# Patient Record
Sex: Female | Born: 1965 | Race: Black or African American | Hispanic: No | Marital: Married | Smoking: Never smoker
Health system: Southern US, Community
[De-identification: ages and names within clinical notes are randomized; demographics above are authoritative.]

## PROBLEM LIST (undated history)

## (undated) DIAGNOSIS — K668 Other specified disorders of peritoneum: Secondary | ICD-10-CM

## (undated) DIAGNOSIS — N809 Endometriosis, unspecified: Secondary | ICD-10-CM

## (undated) DIAGNOSIS — C801 Malignant (primary) neoplasm, unspecified: Secondary | ICD-10-CM

## (undated) DIAGNOSIS — E039 Hypothyroidism, unspecified: Secondary | ICD-10-CM

## (undated) DIAGNOSIS — E049 Nontoxic goiter, unspecified: Secondary | ICD-10-CM

## (undated) DIAGNOSIS — D219 Benign neoplasm of connective and other soft tissue, unspecified: Secondary | ICD-10-CM

## (undated) DIAGNOSIS — N83202 Unspecified ovarian cyst, left side: Secondary | ICD-10-CM

## (undated) DIAGNOSIS — I10 Essential (primary) hypertension: Secondary | ICD-10-CM

## (undated) DIAGNOSIS — D249 Benign neoplasm of unspecified breast: Secondary | ICD-10-CM

## (undated) DIAGNOSIS — J302 Other seasonal allergic rhinitis: Secondary | ICD-10-CM

## (undated) HISTORY — PX: MYOMECTOMY: SHX85

## (undated) HISTORY — PX: LAPAROSCOPY: SHX197

## (undated) HISTORY — DX: Other specified disorders of peritoneum: K66.8

## (undated) HISTORY — DX: Benign neoplasm of connective and other soft tissue, unspecified: D21.9

## (undated) HISTORY — PX: BREAST SURGERY: SHX581

## (undated) HISTORY — DX: Hypothyroidism, unspecified: E03.9

## (undated) HISTORY — PX: TOTAL THYROIDECTOMY: SHX2547

## (undated) HISTORY — DX: Nontoxic goiter, unspecified: E04.9

## (undated) HISTORY — DX: Benign neoplasm of unspecified breast: D24.9

## (undated) HISTORY — DX: Endometriosis, unspecified: N80.9

## (undated) HISTORY — PX: WISDOM TOOTH EXTRACTION: SHX21

## (undated) HISTORY — DX: Unspecified ovarian cyst, left side: N83.202

---

## 2001-12-02 ENCOUNTER — Ambulatory Visit (HOSPITAL_COMMUNITY): Admission: RE | Admit: 2001-12-02 | Discharge: 2001-12-02 | Payer: Self-pay | Admitting: Obstetrics and Gynecology

## 2001-12-02 ENCOUNTER — Encounter: Payer: Self-pay | Admitting: Obstetrics and Gynecology

## 2001-12-13 ENCOUNTER — Other Ambulatory Visit: Admission: RE | Admit: 2001-12-13 | Discharge: 2001-12-13 | Payer: Self-pay | Admitting: Pediatrics

## 2002-02-07 ENCOUNTER — Ambulatory Visit (HOSPITAL_COMMUNITY): Admission: RE | Admit: 2002-02-07 | Discharge: 2002-02-07 | Payer: Self-pay | Admitting: Obstetrics and Gynecology

## 2002-02-07 ENCOUNTER — Encounter (INDEPENDENT_AMBULATORY_CARE_PROVIDER_SITE_OTHER): Payer: Self-pay

## 2002-02-07 ENCOUNTER — Encounter (INDEPENDENT_AMBULATORY_CARE_PROVIDER_SITE_OTHER): Payer: Self-pay | Admitting: Specialist

## 2002-02-07 HISTORY — PX: HYSTEROSCOPY WITH D & C: SHX1775

## 2002-02-25 ENCOUNTER — Ambulatory Visit (HOSPITAL_COMMUNITY): Admission: RE | Admit: 2002-02-25 | Discharge: 2002-02-25 | Payer: Self-pay | Admitting: Endocrinology

## 2002-02-26 ENCOUNTER — Encounter: Payer: Self-pay | Admitting: Endocrinology

## 2002-03-04 ENCOUNTER — Ambulatory Visit (HOSPITAL_COMMUNITY): Admission: RE | Admit: 2002-03-04 | Discharge: 2002-03-04 | Payer: Self-pay | Admitting: Obstetrics and Gynecology

## 2002-03-04 ENCOUNTER — Encounter: Payer: Self-pay | Admitting: Obstetrics and Gynecology

## 2002-03-13 ENCOUNTER — Ambulatory Visit (HOSPITAL_COMMUNITY): Admission: RE | Admit: 2002-03-13 | Discharge: 2002-03-13 | Payer: Self-pay | Admitting: Endocrinology

## 2002-03-13 ENCOUNTER — Encounter: Payer: Self-pay | Admitting: Endocrinology

## 2002-04-14 ENCOUNTER — Encounter (INDEPENDENT_AMBULATORY_CARE_PROVIDER_SITE_OTHER): Payer: Self-pay | Admitting: Specialist

## 2002-04-14 ENCOUNTER — Inpatient Hospital Stay (HOSPITAL_COMMUNITY): Admission: RE | Admit: 2002-04-14 | Discharge: 2002-04-16 | Payer: Self-pay | Admitting: Obstetrics and Gynecology

## 2003-03-12 ENCOUNTER — Other Ambulatory Visit: Admission: RE | Admit: 2003-03-12 | Discharge: 2003-03-12 | Payer: Self-pay | Admitting: Obstetrics and Gynecology

## 2004-02-24 ENCOUNTER — Ambulatory Visit (HOSPITAL_COMMUNITY): Admission: RE | Admit: 2004-02-24 | Discharge: 2004-02-24 | Payer: Self-pay | Admitting: Obstetrics and Gynecology

## 2004-04-25 ENCOUNTER — Ambulatory Visit (HOSPITAL_COMMUNITY): Admission: RE | Admit: 2004-04-25 | Discharge: 2004-04-25 | Payer: Self-pay | Admitting: Obstetrics and Gynecology

## 2004-07-15 ENCOUNTER — Inpatient Hospital Stay (HOSPITAL_COMMUNITY): Admission: AD | Admit: 2004-07-15 | Discharge: 2004-07-20 | Payer: Self-pay | Admitting: Obstetrics and Gynecology

## 2004-07-17 ENCOUNTER — Encounter (INDEPENDENT_AMBULATORY_CARE_PROVIDER_SITE_OTHER): Payer: Self-pay | Admitting: Specialist

## 2005-01-23 ENCOUNTER — Other Ambulatory Visit: Admission: RE | Admit: 2005-01-23 | Discharge: 2005-01-23 | Payer: Self-pay | Admitting: Obstetrics and Gynecology

## 2005-02-14 ENCOUNTER — Ambulatory Visit (HOSPITAL_COMMUNITY): Admission: RE | Admit: 2005-02-14 | Discharge: 2005-02-14 | Payer: Self-pay | Admitting: Endocrinology

## 2005-02-23 ENCOUNTER — Ambulatory Visit (HOSPITAL_COMMUNITY): Admission: RE | Admit: 2005-02-23 | Discharge: 2005-02-23 | Payer: Self-pay | Admitting: Endocrinology

## 2005-02-23 ENCOUNTER — Encounter (INDEPENDENT_AMBULATORY_CARE_PROVIDER_SITE_OTHER): Payer: Self-pay | Admitting: Specialist

## 2005-08-12 IMAGING — US US BIOPSY
1 series · 6 of 6 positions shown · non-contrast
Comparison: 02/14/05 thyroid ultrasound.

CLINICAL DATA: Palpable large right thyroid mass.
Right thyroid mass CORE AND FINE NEEDLE ASPIRATION BIOPSIES:

[Series 1: unknown · 0.07mm/px · 6 of 6 slices shown]
[im 1/6]
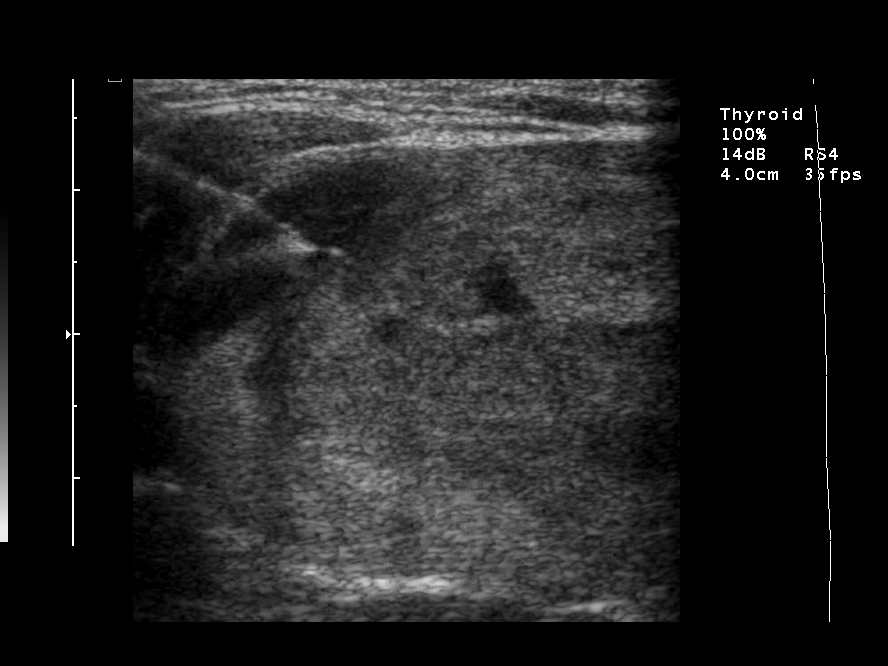
[im 2/6]
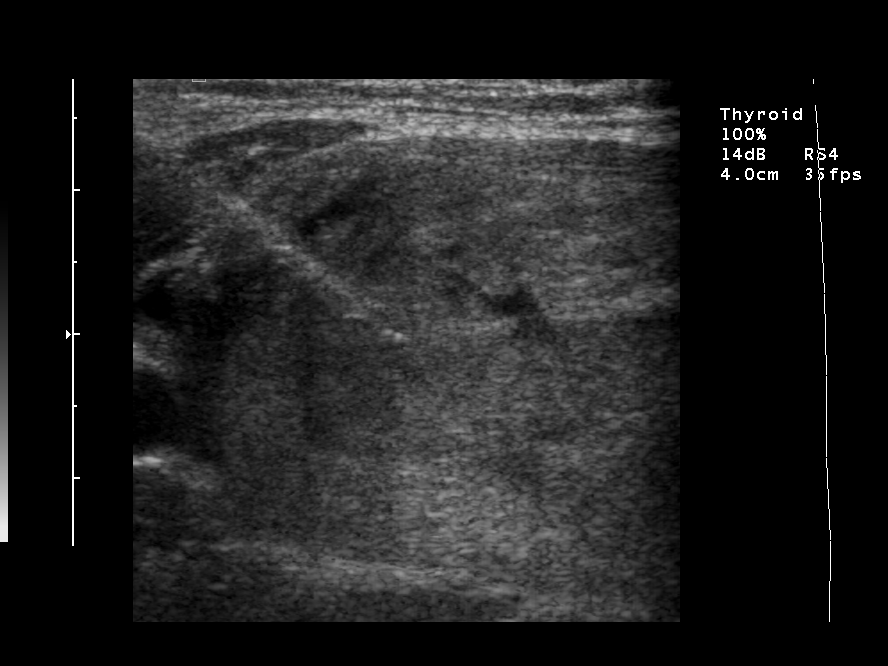
[im 3/6]
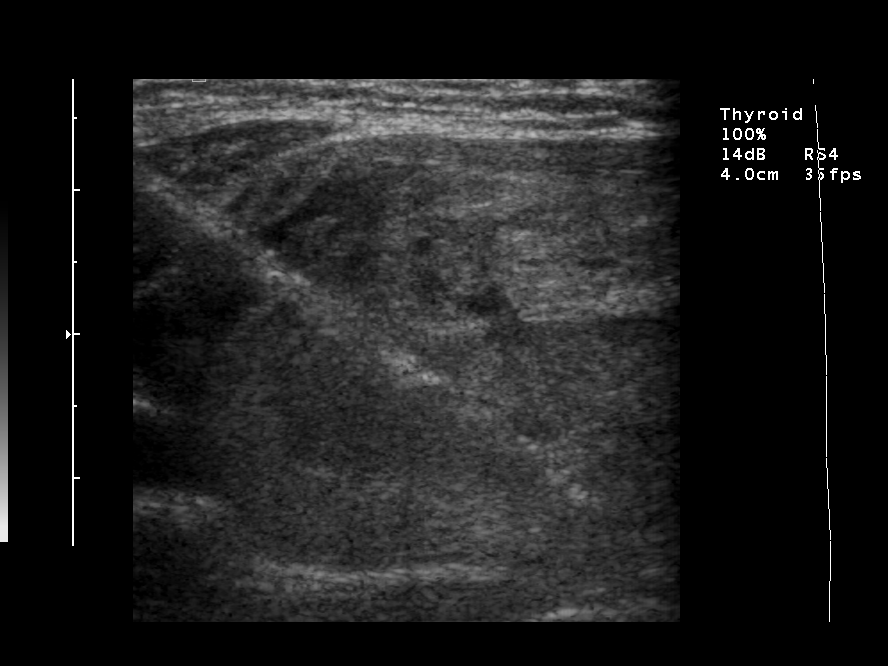
[im 4/6]
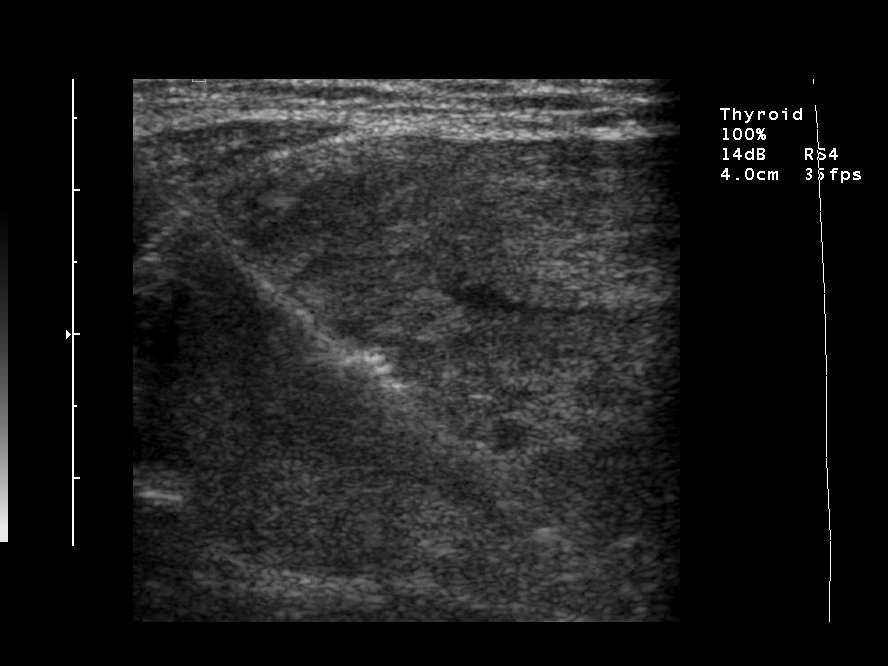
[im 5/6]
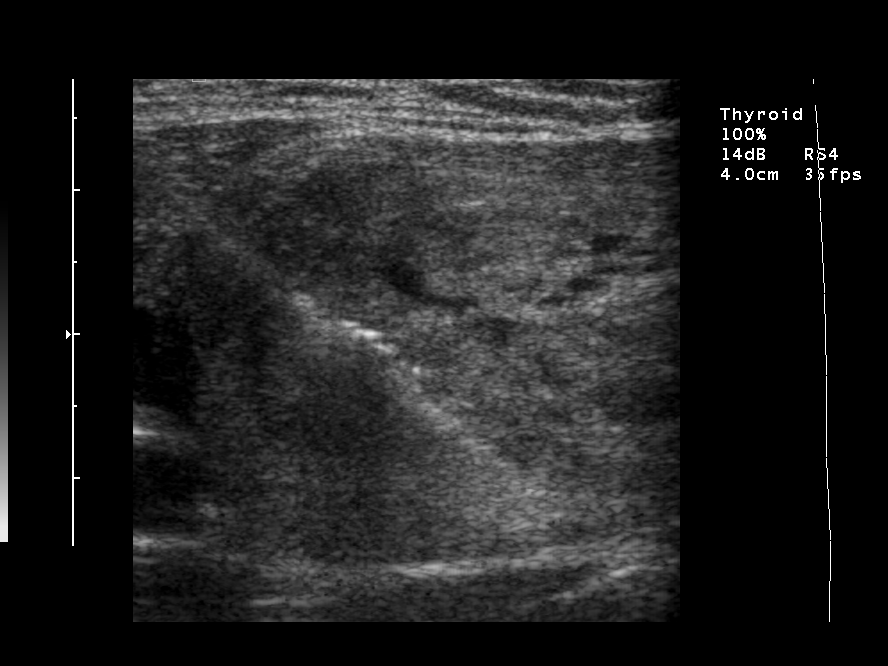
[im 6/6]
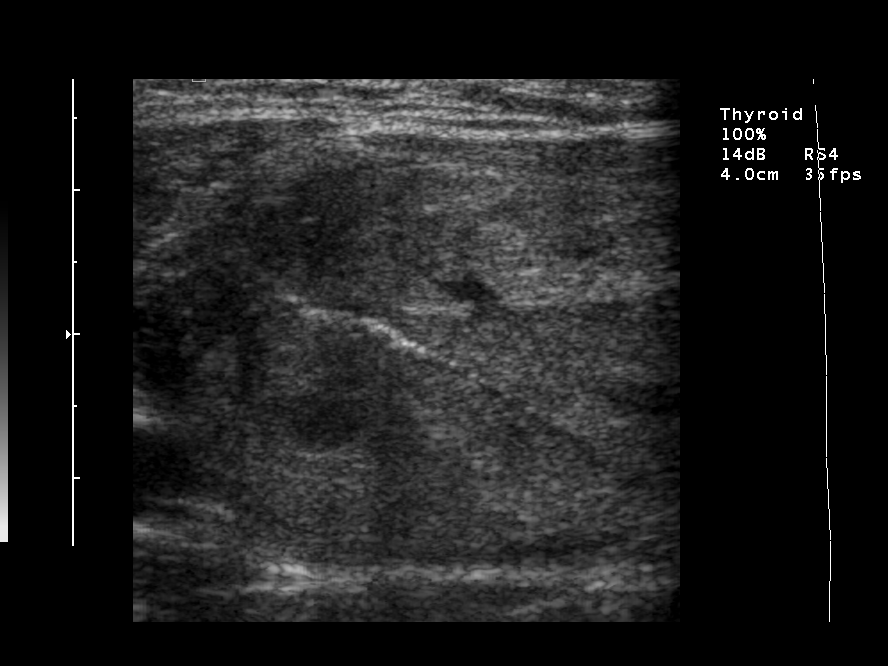

[6 of 6 positions shown; findings below may reference images not displayed]

Radiologist:  Gulzhakhan Kulbaeva, M.D.
Guidance:  Ultrasound.
Complications:  No immediate complications.
Procedure/Findings:  Preliminary ultrasound was performed of the right thyroid lobe re-demonstrating the diffuse right thyroid mass occupying the entire right side.  Under sterile conditions and local anesthesia, a 19 gauge coaxial guide needle was advanced into the lesion.  Through the access, three 20 gauge core biopsies were obtained and placed in formalin.  The 19 gauge needle access was removed.  For cytology, 3 additional FNA biopsies were performed with 25 gauge needles, and slides were prepared with the cytotechnologist.  
The patient tolerated the procedure well.  There were no immediate complications.
IMPRESSION: Right thyroid mass core and FNA biopsies utilizing ultrasound guidance.

## 2005-09-15 ENCOUNTER — Encounter: Admission: RE | Admit: 2005-09-15 | Discharge: 2005-09-15 | Payer: Self-pay | Admitting: Family Medicine

## 2005-12-04 DIAGNOSIS — C801 Malignant (primary) neoplasm, unspecified: Secondary | ICD-10-CM

## 2005-12-04 HISTORY — DX: Malignant (primary) neoplasm, unspecified: C80.1

## 2006-01-26 ENCOUNTER — Other Ambulatory Visit: Admission: RE | Admit: 2006-01-26 | Discharge: 2006-01-26 | Payer: Self-pay | Admitting: Obstetrics and Gynecology

## 2006-02-06 ENCOUNTER — Encounter: Admission: RE | Admit: 2006-02-06 | Discharge: 2006-02-06 | Payer: Self-pay | Admitting: Obstetrics and Gynecology

## 2006-02-22 ENCOUNTER — Encounter: Admission: RE | Admit: 2006-02-22 | Discharge: 2006-02-22 | Payer: Self-pay | Admitting: Obstetrics and Gynecology

## 2006-05-14 ENCOUNTER — Ambulatory Visit (HOSPITAL_COMMUNITY): Admission: RE | Admit: 2006-05-14 | Discharge: 2006-05-14 | Payer: Self-pay | Admitting: Endocrinology

## 2006-08-07 ENCOUNTER — Encounter: Admission: RE | Admit: 2006-08-07 | Discharge: 2006-08-07 | Payer: Self-pay | Admitting: Obstetrics and Gynecology

## 2006-09-10 ENCOUNTER — Ambulatory Visit (HOSPITAL_COMMUNITY): Admission: RE | Admit: 2006-09-10 | Discharge: 2006-09-11 | Payer: Self-pay | Admitting: General Surgery

## 2006-09-10 ENCOUNTER — Encounter (INDEPENDENT_AMBULATORY_CARE_PROVIDER_SITE_OTHER): Payer: Self-pay | Admitting: Specialist

## 2006-11-06 ENCOUNTER — Encounter (INDEPENDENT_AMBULATORY_CARE_PROVIDER_SITE_OTHER): Payer: Self-pay | Admitting: *Deleted

## 2006-11-06 ENCOUNTER — Ambulatory Visit (HOSPITAL_COMMUNITY): Admission: RE | Admit: 2006-11-06 | Discharge: 2006-11-07 | Payer: Self-pay | Admitting: General Surgery

## 2006-11-23 ENCOUNTER — Ambulatory Visit (HOSPITAL_BASED_OUTPATIENT_CLINIC_OR_DEPARTMENT_OTHER): Admission: RE | Admit: 2006-11-23 | Discharge: 2006-11-23 | Payer: Self-pay | Admitting: Ophthalmology

## 2006-12-24 ENCOUNTER — Encounter (HOSPITAL_COMMUNITY): Admission: RE | Admit: 2006-12-24 | Discharge: 2007-03-19 | Payer: Self-pay | Admitting: Endocrinology

## 2007-02-12 ENCOUNTER — Encounter: Admission: RE | Admit: 2007-02-12 | Discharge: 2007-02-12 | Payer: Self-pay | Admitting: Obstetrics and Gynecology

## 2007-02-20 IMAGING — CR DG CHEST 2V
2 series · 2 of 2 positions shown · non-contrast
Comparison: none

CLINICAL DATA: Preoperative respiratory exam for thyroidectomy.  Thyroid nodule. 
 CHEST - 2 VIEW:
 The heart size and mediastinal contours are within normal limits.  Both lungs are clear.  The visualized skeletal structures are unremarkable.

[view not recorded (1 of 2)]
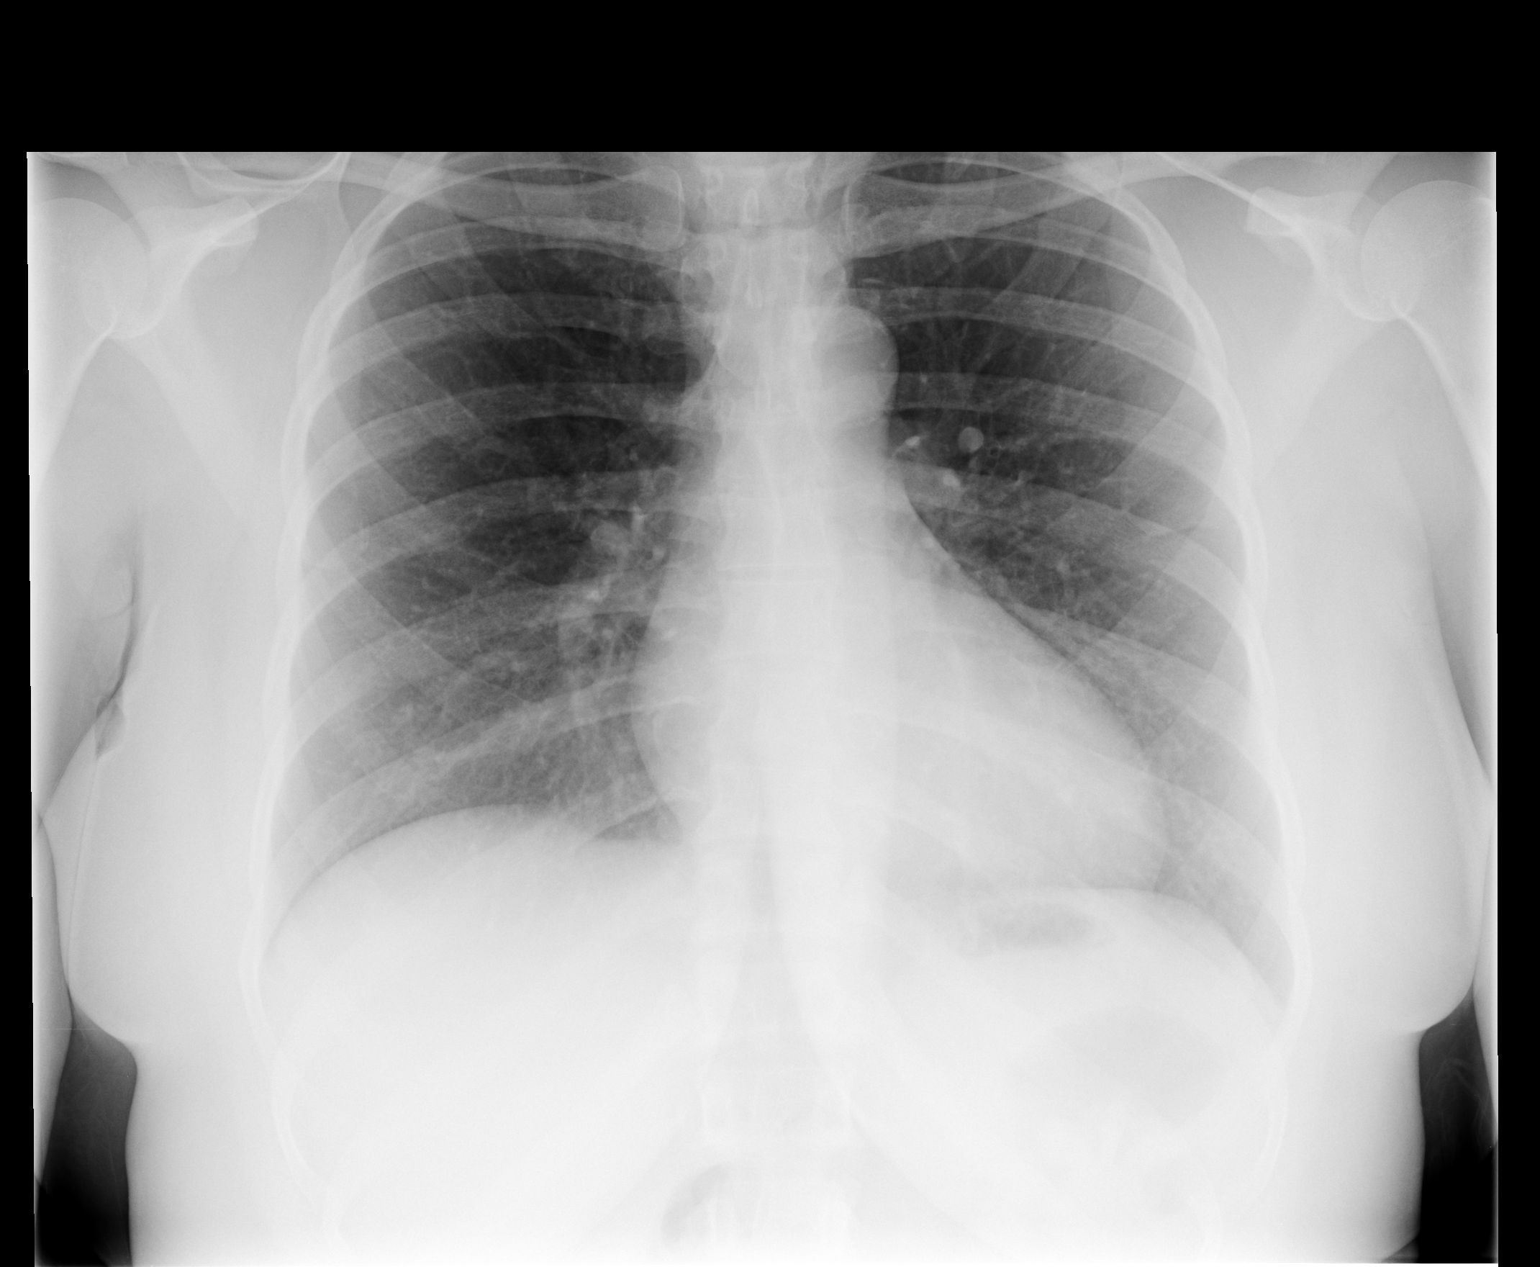

[view not recorded (2 of 2)]
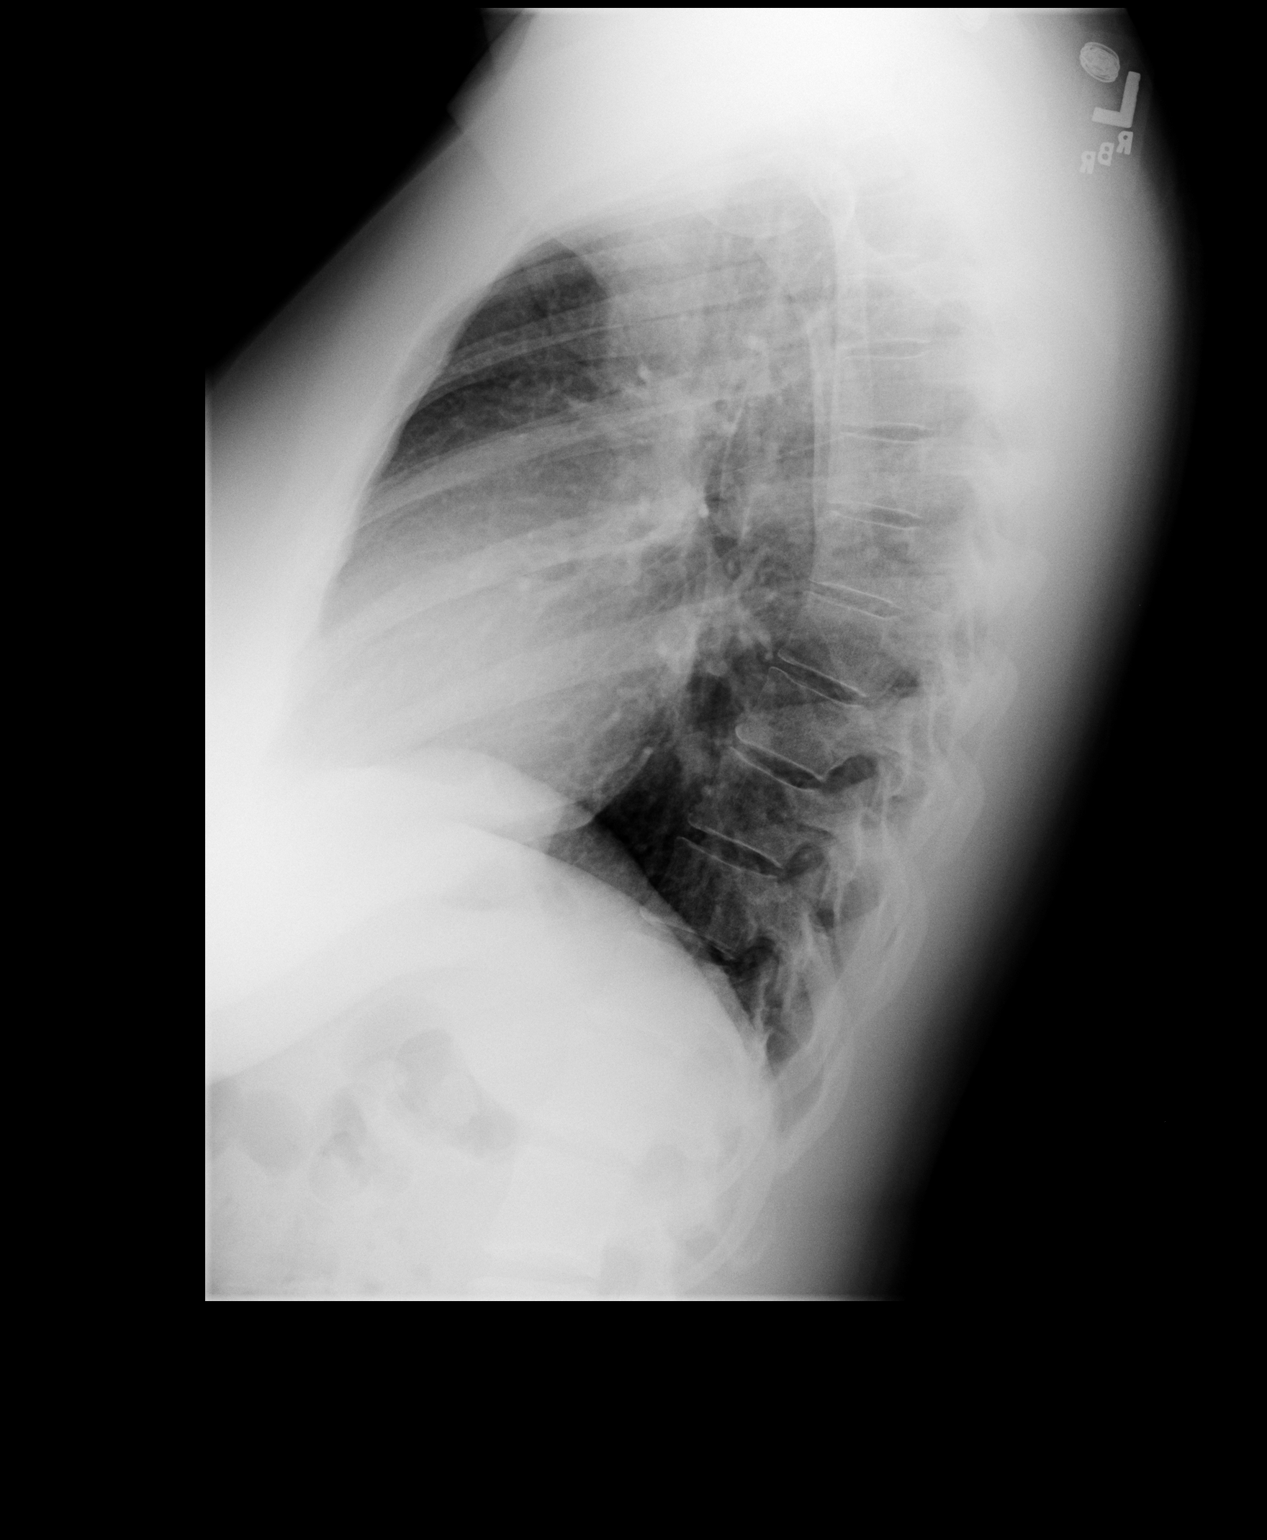

[2 of 2 positions shown; findings below may reference images not displayed]

IMPRESSION: No active cardiopulmonary disease.

## 2007-07-15 ENCOUNTER — Encounter (HOSPITAL_COMMUNITY): Admission: RE | Admit: 2007-07-15 | Discharge: 2007-07-19 | Payer: Self-pay | Admitting: Endocrinology

## 2008-02-14 ENCOUNTER — Encounter: Admission: RE | Admit: 2008-02-14 | Discharge: 2008-02-14 | Payer: Self-pay | Admitting: Obstetrics and Gynecology

## 2008-03-02 ENCOUNTER — Encounter (INDEPENDENT_AMBULATORY_CARE_PROVIDER_SITE_OTHER): Payer: Self-pay | Admitting: Diagnostic Radiology

## 2008-03-02 ENCOUNTER — Encounter: Admission: RE | Admit: 2008-03-02 | Discharge: 2008-03-02 | Payer: Self-pay | Admitting: Obstetrics and Gynecology

## 2008-07-06 ENCOUNTER — Encounter: Admission: RE | Admit: 2008-07-06 | Discharge: 2008-07-06 | Payer: Self-pay | Admitting: Endocrinology

## 2008-07-07 ENCOUNTER — Encounter: Admission: RE | Admit: 2008-07-07 | Discharge: 2008-07-07 | Payer: Self-pay | Admitting: Endocrinology

## 2008-09-23 ENCOUNTER — Encounter: Admission: RE | Admit: 2008-09-23 | Discharge: 2008-09-23 | Payer: Self-pay | Admitting: Family Medicine

## 2008-12-04 DIAGNOSIS — K668 Other specified disorders of peritoneum: Secondary | ICD-10-CM

## 2008-12-04 HISTORY — PX: BREAST EXCISIONAL BIOPSY: SUR124

## 2008-12-04 HISTORY — DX: Other specified disorders of peritoneum: K66.8

## 2008-12-23 ENCOUNTER — Ambulatory Visit (HOSPITAL_COMMUNITY): Admission: RE | Admit: 2008-12-23 | Discharge: 2008-12-23 | Payer: Self-pay | Admitting: Internal Medicine

## 2009-02-16 ENCOUNTER — Encounter: Admission: RE | Admit: 2009-02-16 | Discharge: 2009-02-16 | Payer: Self-pay | Admitting: Obstetrics and Gynecology

## 2009-02-22 ENCOUNTER — Encounter: Admission: RE | Admit: 2009-02-22 | Discharge: 2009-02-22 | Payer: Self-pay | Admitting: General Surgery

## 2009-03-22 ENCOUNTER — Ambulatory Visit (HOSPITAL_BASED_OUTPATIENT_CLINIC_OR_DEPARTMENT_OTHER): Admission: RE | Admit: 2009-03-22 | Discharge: 2009-03-22 | Payer: Self-pay | Admitting: General Surgery

## 2009-03-22 ENCOUNTER — Encounter: Admission: RE | Admit: 2009-03-22 | Discharge: 2009-03-22 | Payer: Self-pay | Admitting: General Surgery

## 2009-03-22 ENCOUNTER — Encounter (INDEPENDENT_AMBULATORY_CARE_PROVIDER_SITE_OTHER): Payer: Self-pay | Admitting: General Surgery

## 2009-07-12 ENCOUNTER — Encounter (HOSPITAL_COMMUNITY): Admission: RE | Admit: 2009-07-12 | Discharge: 2009-09-02 | Payer: Self-pay | Admitting: Endocrinology

## 2009-08-05 IMAGING — US UNKNOWN US STUDY
1 series · 6 of 6 positions shown · non-contrast
Comparison: 02/14/2008, 02/12/2007, and  02/06/2006 studies.

CLINICAL DATA: Reevaluation of left breast nodule.  The patient
has undergone previous ultrasound guided core biopsy of the left
breast nodule.  The pathology demonstrated biphasic proliferation
consistent with a complex fibroadenoma.  The patient failed to
return for recommended 6-month follow-up evaluation of the left
breast.

DIGITAL DIAGNOSTIC  BILATERAL  MAMMOGRAM  WITH CAD AND LEFT BREAST
ULTRASOUND:

[Series 1: unknown us study · 6 of 6 slices shown]
[im 1/6]
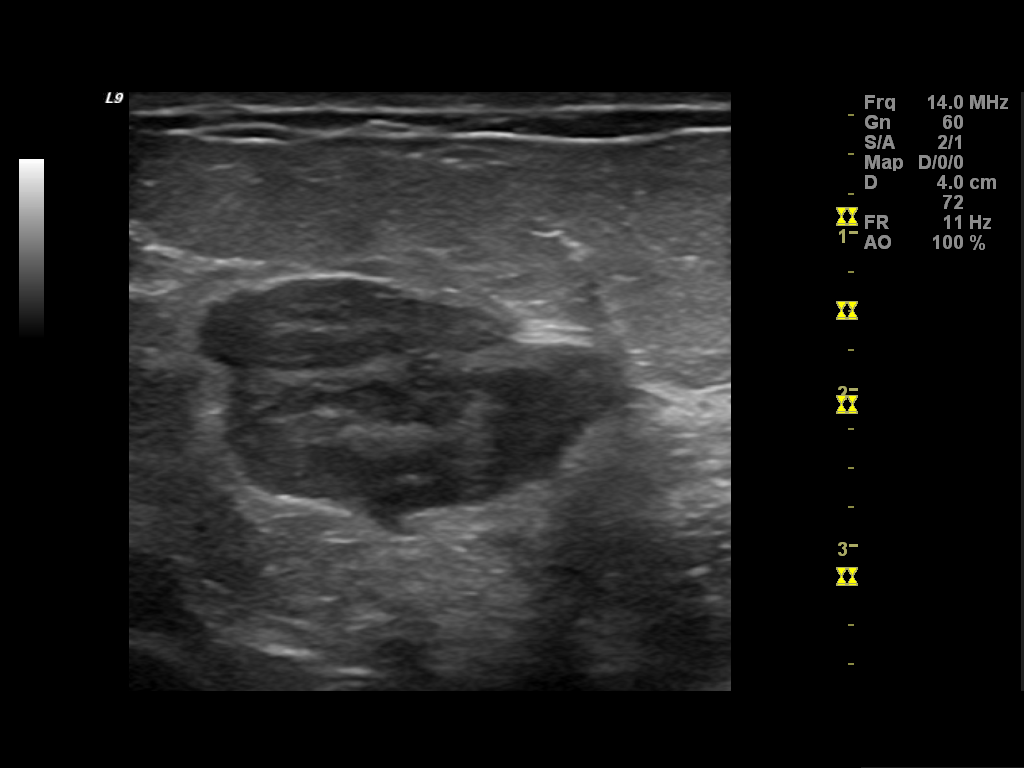
[im 2/6]
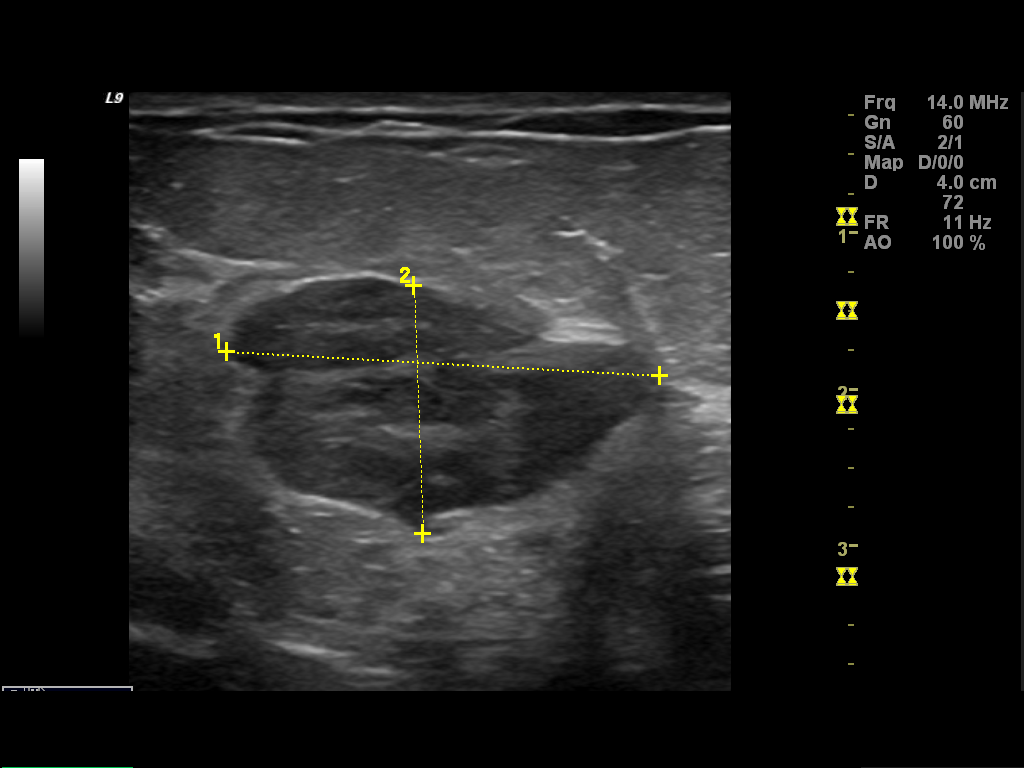
[im 3/6]
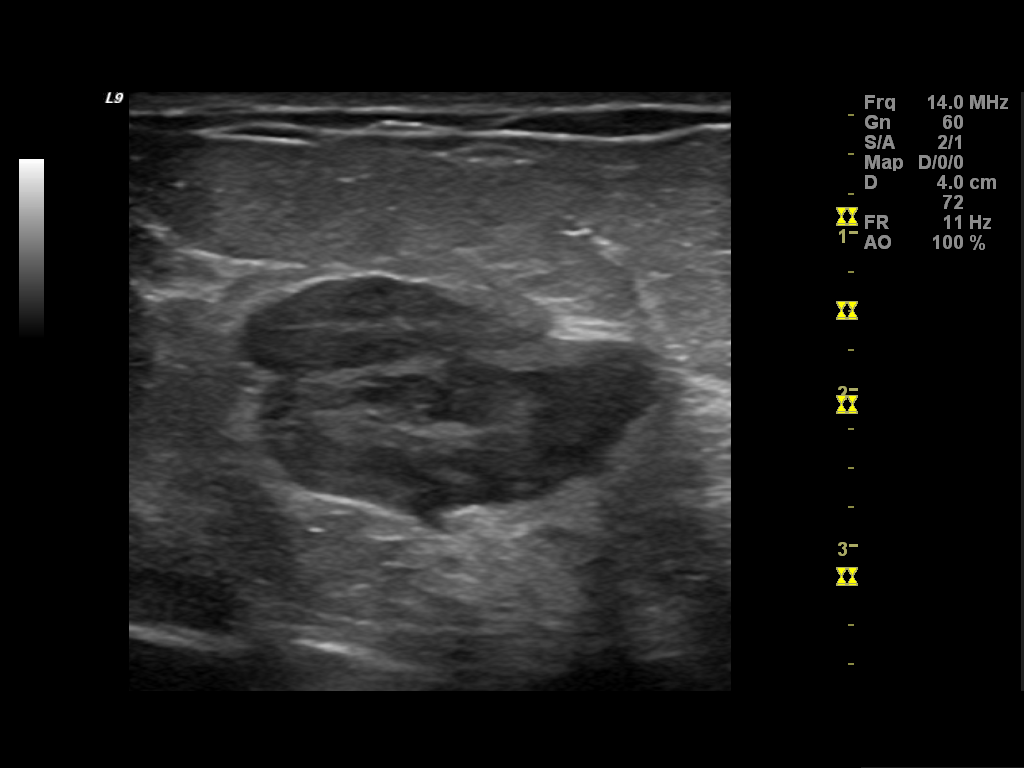
[im 4/6]
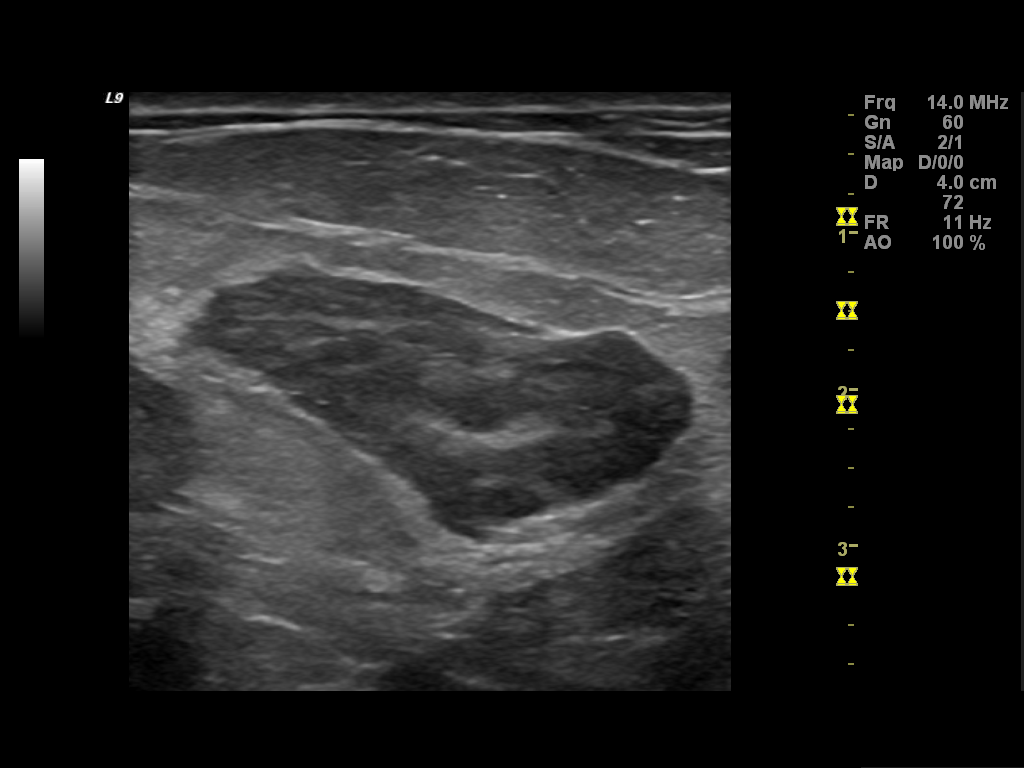
[im 5/6]
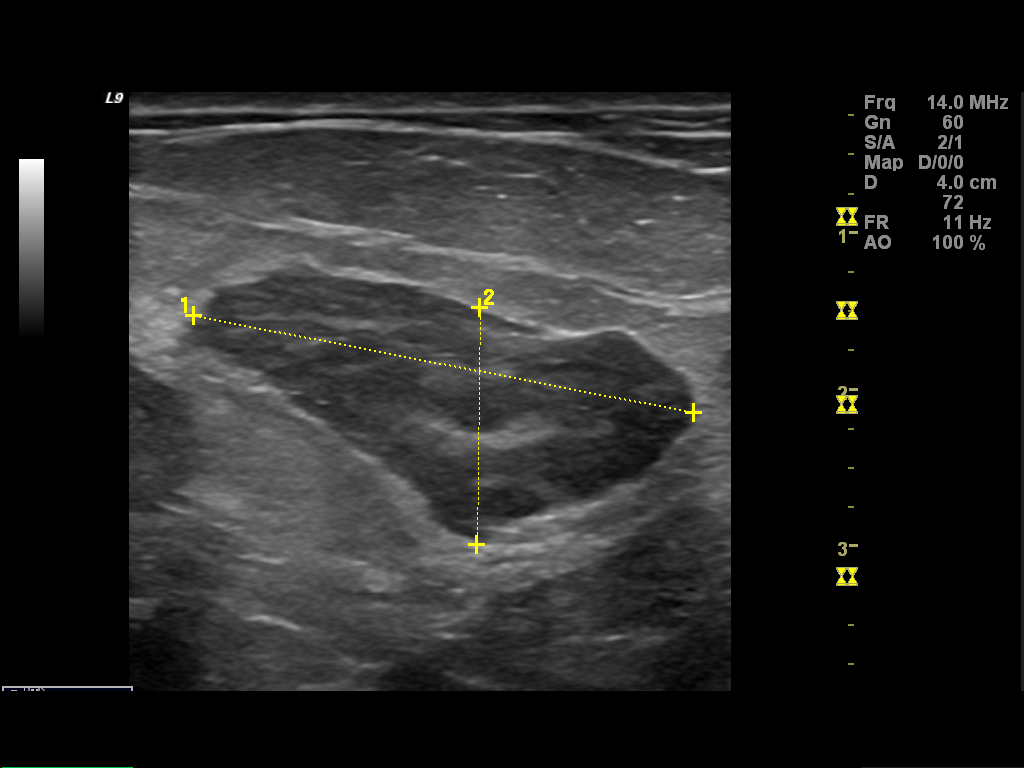
[im 6/6]
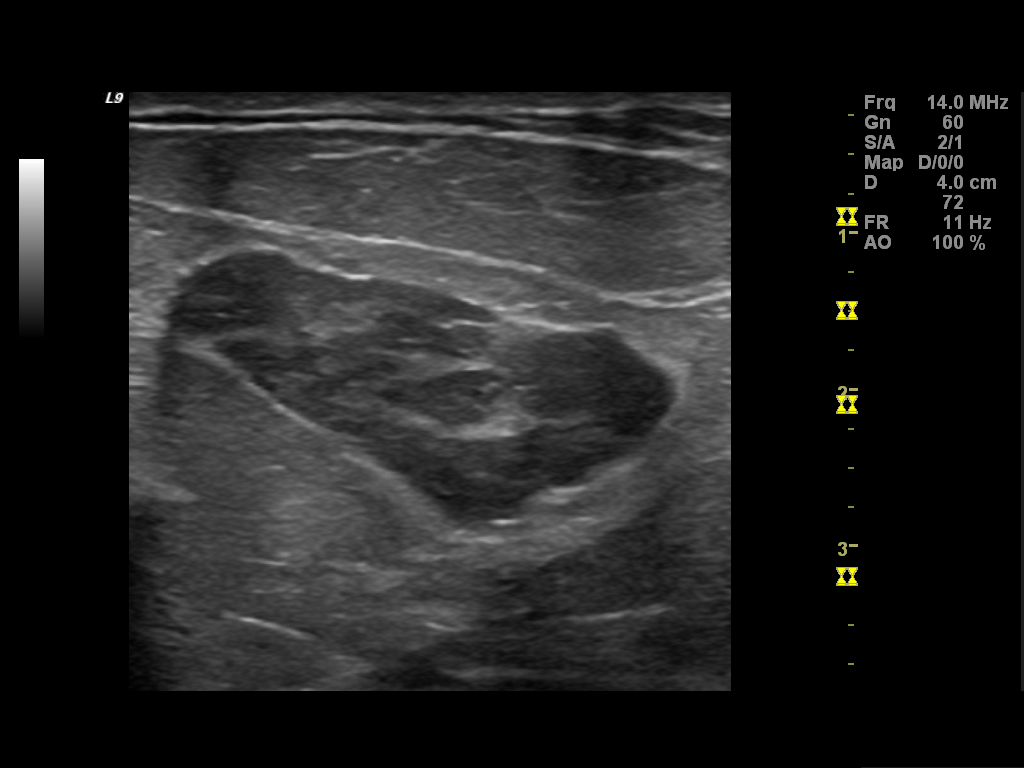

[6 of 6 positions shown; findings below may reference images not displayed]

FINDINGS: There is a scattered fibroglandular parenchymal pattern.
There is a circumscribed nodule located superiorly within the left
breast at the 11 o'clock position.  This has increased in size
when compared with prior studies.  This now measures 2.6 x 3.3 x
3.6 cm in size by mammography.  An area of questionable
distortion/density located within the upper outer quadrant left
breast did not persist on spot compression views.  The right breast
is unchanged.

On physical exam, the the patient has large breasts and the left
breast mass is not definitely palpable.  There is no discrete
palpable lesion.

Ultrasound is performed, showing a solid heterogeneous mass with a
lobulated circumscribed contour located at the 11 o'clock position
within the left breast 6 cm from the nipple.  This measures 3.2 x
2.8 x 1.6 cm in size.  This has increased in size when compared
with the prior studies.  Given the interval change, surgical
consultation for excision is recommended.
IMPRESSION: 1.  Interval increase in size of the circumscribed mass located
within the left breast at the 11 o'clock position as discussed
above.  Surgical consultation for possible surgical excision is
recommended.  The patient has been scheduled to see Dr. Tiger on
03/04/2009.

 BI-RADS CATEGORY 4:  Suspicious abnormality - biopsy should be
considered.

## 2010-02-17 ENCOUNTER — Encounter: Admission: RE | Admit: 2010-02-17 | Discharge: 2010-02-17 | Payer: Self-pay | Admitting: Obstetrics and Gynecology

## 2010-09-12 ENCOUNTER — Encounter (HOSPITAL_COMMUNITY)
Admission: RE | Admit: 2010-09-12 | Discharge: 2010-10-02 | Payer: Self-pay | Source: Home / Self Care | Admitting: Endocrinology

## 2011-03-11 LAB — THYROGLOBULIN LEVEL: Thyroglobulin: <0.2 ng/mL — ABNORMAL LOW (ref 2.0–35.0)

## 2011-03-11 LAB — HCG, SERUM, QUALITATIVE: Preg, Serum: NEGATIVE

## 2011-03-13 ENCOUNTER — Other Ambulatory Visit: Payer: Self-pay | Admitting: Obstetrics and Gynecology

## 2011-03-13 DIAGNOSIS — Z1231 Encounter for screening mammogram for malignant neoplasm of breast: Secondary | ICD-10-CM

## 2011-03-15 ENCOUNTER — Ambulatory Visit
Admission: RE | Admit: 2011-03-15 | Discharge: 2011-03-15 | Disposition: A | Discharge status: Home / Self Care | Payer: Private Health Insurance - Indemnity | Source: Ambulatory Visit | Attending: Obstetrics and Gynecology | Admitting: Obstetrics and Gynecology

## 2011-03-15 DIAGNOSIS — Z1231 Encounter for screening mammogram for malignant neoplasm of breast: Secondary | ICD-10-CM

## 2011-03-16 ENCOUNTER — Other Ambulatory Visit: Payer: Self-pay | Admitting: Obstetrics and Gynecology

## 2011-03-16 DIAGNOSIS — R928 Other abnormal and inconclusive findings on diagnostic imaging of breast: Secondary | ICD-10-CM

## 2011-03-17 ENCOUNTER — Ambulatory Visit
Admission: RE | Admit: 2011-03-17 | Discharge: 2011-03-17 | Disposition: A | Discharge status: Home / Self Care | Payer: Private Health Insurance - Indemnity | Source: Ambulatory Visit | Attending: Obstetrics and Gynecology | Admitting: Obstetrics and Gynecology

## 2011-03-17 DIAGNOSIS — R928 Other abnormal and inconclusive findings on diagnostic imaging of breast: Secondary | ICD-10-CM

## 2011-04-18 NOTE — Op Note (Signed)
[name], Brennan      ACCOUNT NO.:  [id number]   MEDICAL RECORD NO.:  [id number]          PATIENT TYPE:  AMB   LOCATION:  DSC                          FACILITY:  [facility]   PHYSICIAN:  Angelia Mould. [name], M.D.DATE OF BIRTH:  1965/12/13   DATE OF PROCEDURE:  03/22/2009  DATE OF DISCHARGE:                               OPERATIVE REPORT   PREOPERATIVE DIAGNOSIS:  Left breast mass.   POSTOPERATIVE DIAGNOSIS:  Left breast mass.   OPERATION PERFORMED:  Excision of left breast mass with needle  localization and specimen mammogram.   SURGEON:  Angelia Mould. [name], MD   OPERATIVE INDICATIONS:  This is a 45 year old African American female  who has not really had any breast problems in the past until recently.  She had a mammogram a year ago and they found a mass at the 11 o'clock  position on the left breast.  An image-guided biopsy of the left breast  mass at 11 o'clock position showed biphasic proliferation consistent  with either complex fibroadenoma or a mammary hamartoma.  She had  followup exam recently and recent imaging studies, they again showed  that the breasts are large.  There was no palpable mass according to the  radiologist.  There is a well-circumscribed mass at the 11 o'clock  position of the left breast measuring 3.6 x 3.3 x 2.6 cm, and this had  clearly enlarged in size.  Dr. [name] at the [facility]  [place] felt that this area should be excised.  At this point the  patient was referred to me. I have reviewed the imaging studies and I  agree.  We are hopeful that this is a benign process.  The patient is in  agreement with this plan.   OPERATIVE TECHNIQUE:  The patient underwent wire localization by Dr.  [name] at the [facility] and then was sent to  the [facility].  The localization looked good.  The patient  was taken to the operating room and underwent general anesthesia.  Intravenous antibiotics were  given.  The left breast was prepped and  draped in a sterile fashion.  The patient was identified as correct  patient, correct procedure, and correct site.  The imaging studies were  reviewed.  The wire was noted to be entering the breast medially at  about the 10:30 position and directed posteriorly and laterally.  I made  a curved incision in the upper inner quadrant of the left breast  overlying the area where I thought the mass would be.  This was at least  4-5 cm peripheral to the areolar margin, but paralleled the areola.  Using a knife for the incision and electrocautery for dissection, I took  the dissection down into the breast tissue and widely around the  localizing wire.  The specimen was removed.  It was oriented and marked  with the 6-color margin marker kit.  A specimen mammogram was obtained.  Dr. [name] said that it looked very good and that the mass was in the  center of the specimen.  This was sent for routine histology.  Hemostasis was excellent  and achieved with electrocautery.  The wound  was irrigated with saline.  The deeper breast tissue was closed in  layers with interrupted sutures of 3-0 Vicryl and skin was closed with  running  subcuticular suture of 4-0 Monocryl and Steri-Strips.  Clean bandages  were placed and the patient was taken to the recovery room in stable  condition.  Estimated blood loss was about 10-15 mL.  Complications  none.  Sponge, needle, and instrument counts were correct.      Angelia Mould. [name], M.D.  Electronically Signed     HMI/MEDQ  D:  03/22/2009  T:  03/23/2009  Job:  [id number]   cc:   Janine Limbo, M.D.  Duncan Dull, M.D.  Naima A. Normand Sloop, M.D.

## 2011-04-21 NOTE — Op Note (Signed)
[name], [name]                  ACCOUNT NO.:  [id number]   MEDICAL RECORD NO.:  [id number]                   PATIENT TYPE:  INP   LOCATION:  9145                                 FACILITY:  [facility]   PHYSICIAN:  Osborn Coho, M.D.                DATE OF BIRTH:  1966-06-03   DATE OF PROCEDURE:  07/17/2004  DATE OF DISCHARGE:                                 OPERATIVE REPORT   PREOPERATIVE DIAGNOSES:  1. Term intrauterine pregnancy.  2. Premature rupture of membranes.  3. Failure to progress.  4. Chorioamnionitis.   POSTOPERATIVE DIAGNOSES:  1. Term intrauterine pregnancy.  2. Premature rupture of membranes.  3. Failure to progress.  4. Chorioamnionitis.   PROCEDURE:  Primary low transverse cesarean section via Pfannenstiel skin  incision at the site of the prior scar.   SURGEON:  Osborn Coho, M.D.   ASSISTANT:  Rica Koyanagi, C.N.M.   ANESTHESIA:  Epidural.   FLUIDS:  1500 mL.   ESTIMATED BLOOD LOSS:  700 mL.   URINE OUTPUT:  250 mL.   COMPLICATIONS:  None.   FINDINGS:  Live female infant with Apgars of 8 at one minute and 8 at five  minutes.  Placenta sent to pathology.   DESCRIPTION OF PROCEDURE:  The patient was taken to the operating room after  the risks, benefits and alternatives were discussed with the patient.  The  patient verbalized understanding and consent signed and witnessed.  The  patient was brought to the surgical level via her epidural and prepped and  draped in the normal sterile fashion.  A Pfannenstiel skin incision was made  at the site of the prior scar and carried down to the underlying layer of  fascia and excised in the midline bilaterally.  The fascia was extended  bilaterally with the Mayo scissors.  Kocher clamps were placed in the  inferior aspect of the fascial incision and the rectus muscle excised from  the fascia.  The same was done on the superior aspect of the fascial  incision.  The muscle was separated in the  midline with a hemostat and the  peritoneum entered bluntly.  The peritoneum was then extended manually and  the bladder blade placed.  Bladder flap created with the Metzenbaum  scissors.  Uterine incision was made with the scalpel and extended  bilaterally with the bandage scissors.  The infant was delivered and DeLee  suctioned via the oropharynx.  The cord was clamped and cut and the infant  was handed to the waiting pediatricians.  The placenta was removed via  fundal massage.  The placenta was sent to pathology.  The uterus was cleared  of all clots and debris.  The uterine incision was repaired with 0 Vicryl in  a running locked fashion and a second imbricating layer was performed.  Several bleeders were made hemostatic with 3-0 Vicryl using figure-of-eight  stitches and a running interlocked stitch on the lower uterine segment  behind the bladder.  The intra-abdominal cavity was copiously irrigated.  The peritoneum was closed with 3-0 Vicryl in a running fashion.  The fascia  was repaired with 3-0 Vicryl in a running fashion.  The subcutaneous tissue  was irrigated and made hemostatic with the Bovie.  The skin was closed with  staples.  Sponge, lap and needle count were correct.  The patient was  returned to the recovery room in good condition.                                               Osborn Coho, M.D.    AR/MEDQ  D:  07/17/2004  T:  07/18/2004  Job:  [id number]

## 2011-04-21 NOTE — Op Note (Signed)
[facility]  Patient:    [name], [name] Visit Number: [id number] MRN: [id number]          Service Type: DSU Location: [facility] Attending Physician:  Jaymes Graff A Dictated by:   Pierre Bali Normand Sloop, M.D. Proc. Date: 02/07/02 Admit Date:  02/07/2002 Discharge Date: 02/07/2002                             Operative Report  PREOPERATIVE DIAGNOSIS:       Left ovarian cyst with menorrhagia and uterine                               fibroids.  POSTOPERATIVE DIAGNOSES:      1. Left ovarian cyst with menorrhagia and                                  uterine fibroids.                               2. 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                               3. Pelvic adhesions.                               4. Large uterine fibroids.                               5. Questionable left ovarian cyst.  OPERATION:                    1. D&C and hysteroscopy.                               2. Diagnostic laparoscopy.                               3. Chromopertubation.                               4. Lysis of adhesions.                               5. Cautery of fibroids and Hemoclips and cautery                                  of mesosalpinx.  SURGEON:                      Naima A. Normand Sloop, M.D.  ASSISTANT:                    Janine Limbo, M.D.  ESTIMATED BLOOD LOSS:         About 200 cc.  IV FLUIDS:  2500 cc of crystalloid.  URINE OUTPUT:                 375 cc, at one point the blood-tinged.  The bladder was seen and noted to be fine, and the urine cleared up after further drainage.  The deficit with the hysteroscopy of 3% sorbitol was 100.  COMPLICATIONS:                With lysis of adhesions of the left ovary, there was some bleeding noted in the mesosalpinx.  This was made stable using cautery and Hemoclips.  FINDINGS:                     The patient had a 14-16 week size large uterus. Actually, no adnexal  masses were palpated on exam, and the findings during laparoscopy was a large fundal fibroid, about 6 cm, and a right pedunculated fibroid, measuring about 5 cm.  She had normal-appearing tubes bilaterally. No dye was seen with chromopertubation.  However, at the end of the case, there was dye along the patients sheet, along her buttocks and along the floor.  So, we are not sure if it ever reached the tube.  She had normal right ovary, normal-appearing left ovary.  However, was unable to see the under side of the ovary secondary to trying to lyse the dense adhesions, and getting into bleeding.  It was thought that in order to remove the cyst, she would probably need a laparotomy, and the patient refused to have anything done about her fibroids at this time, but I am hoping that after speaking with the patient, if we are going to do a laparotomy, to do a myomectomy with the removal of a left ovarian cyst.  On hysteroscopy, you could see the submucosal components of the fibroid at the fundus and on the left.  The patient had scant endometrium noted.  Both ostia were seen, and on laparoscopy, the patient had normal abdominal anatomy besides the pelvic anatomy that was just mentioned.  She had a normal-appearing appendix, normal-appearing, and normal-appearing liver, normal-appearing omentum without any kinking.  DESCRIPTION OF PROCEDURE:     The patient was taken to the operating room and placed in the dorsolithotomy position, after given general anesthesia, and prepped and draped in a normal sterile fashion.  The patient was then examined and noted to have the findings noted above.  A Foley catheter was placed into the urethra and bivalve speculum was placed into the vagina.  The anterior lip of the cervix was grasped with a single-tooth tenaculum.  The cervix was dilated with a Pratt dilators to 21.  The hysteroscope was placed inside the uterus.  You could see the submucosal components of  the fibroid with scant endometrium as noted above.  The hysteroscope was then removed.  A sharp curettage was done, and again, you could feel the irregular cavity secondary to the fibroids with a curettage on the patients right side, and the profunda.  The curettage was then removed, and the uterine manipulator was then placed into the HUMI uterine manipulator and was placed into the uterus and cervix for means to manipulate the uterus.  Attention was then turned to the patients abdomen where a 10 mm vertical incision was made with a knife.  The patient was noted to have some debris in her belly button and this was cleaned out before the incision was made and further cleaned with Betadine and Q-tips.  Five  cubic centimeters of 0.25% Marcaine was placed into the umbilical site.  A vertical skin incision was then made with the knife and the Veress needle was placed at a 45 degree angle, tenting the abdominal wall.  Intra-abdominal placement was confirmed with 4-0 silk, syringe, and decrease in CO2 pressure.  The abdomen was then insufflated with CO2 gas to about 3 L.  A 10 mm trocar was placed into the abdominal cavity and the findings noted above were seen.  The patients left lower quadrant was then inspected.  Three cubic centimeters of 0.25% Marcaine was placed in the left lower quadrant.  A small incision was made with the scalpel, a 5 mm incision, and the 5 mm trocar was placed under direct visualization.  Hemostasis was assured.  A Nezhat was placed and pelvic washings were obtained and sent to pathology.  The findings noted above were seen.  The patients right tube and ovary was identified and brought out the cul-de-sac.  The ovary was noted to be normal.  Pictures were taken.  Tubes were normal in appearance and normal fimbria.  Again, the pedunculated fibroids were seen.  There was a little area noted to be bleeding on one of the fibroids which was made hemostatic using Klepinger  cautery.  Attention was then turned to the patients left side, where a 5 mm trocar site  was placed under direct visualization with the laparoscope.  However, the trocar did interrupted the round ligament to where there was some bleeding. This was made hemostatic using Klepingers.  Hemostasis was noted.  The tube was reflected.  The patient had some dense adhesions to the pelvic side wall of the bowel into the uterus on the left side.  I began lysing these adhesions both sharply and bluntly.  There was some bleeding noted in the mesosalpinx. The 5 mm trocar placed on the left site was then extended to a 10.  A 10 mm trocar and port was placed.  Clips were then used to make the area that was bleeding in the mesosalpinx hemostatic.  The area was irrigated copiously with the Nezhat and the fluid was then suctioned.  The area was noted to be hemostatic.  At this point, tried to lift her ovary from the cul-de-sac, and there was still so many dense adhesions, and tried to lift it with the atraumatic forceps and the traumatic forceps with tooth without any help.  We tried to lyse the adhesions from the pelvic side wall, but we were unable to see the ureter and unable to dissect it without appropriate visualization.  At this time, because we knew there was no cancer, it was decided that the patient in order to remove the cyst would have to have a laparotomy.  The patient also needs a myomectomy because she is interested in pregnancy, and this will be recommended.  Because these were recommended and at this time, she is not ready for a fibroid, we decided to stop the laparoscopy, and wake the patient up, and talk to the patient, and hopefully bring her back for the laparotomy for the cyst and do the myomectomy at the same time.  First, all of the areas were irrigated copiously with lactated Ringers with the Nezhat.  The patient was placed in Trendelenburg and reverse Trendelenburg in order to  get most of the fluid out.  All areas were noted to be hemostatic. Both lower ports were removed under direct visualization.  The gas was allowed to leave the  abdomen.  The 10 mm umbilical port was removed with direct visualization.  Hemostasis was assured.  The fascia was closed at both 10 mm ports with 0 Vicryl.  The skin was closed with 3-0 Vicryl in a subcuticular fashion.  Sponge, lap, and needle counts were correct x2, and the HUMI was removed, and good hemostasis was noted at the old tenaculum site and along the cervix.  Sponge, lap, and needle counts were x2.  The patient went to the recovery room in stable condition.  I spoke with her family and told them the findings. Dictated by:   Pierre Bali. Normand Sloop, M.D. Attending Physician:  Michael Litter DD:  02/07/02 TD:  02/10/02 Job: [id number]

## 2011-04-21 NOTE — H&P (Signed)
[facility]  Patient:    [name], [name] Visit Number: [id number] MRN: [id number]          Service Type: Attending:  Naima A. Normand Sloop, M.D. Dictated by:   Pierre Bali. Normand Sloop, M.D. Adm. Date:  02/06/02                           History and Physical  HISTORY OF PRESENT ILLNESS:   Ms. [name] is a 45 year old African-American female, who presented to me for evaluation of ovarian cysts and mid cycle spotting along with fibroids.  The patient was noted to have ovarian cysts on ultrasound since October 2002, and despite several ultrasounds, the left ovarian cysts have not resolved in size.  The left ovary is measured 3.9 x 6.5 x 6.1 with two complex cysts consistent with either endometrioma or hemorrhagic cysts.  ______ were measuring 4.8 x 2.6 x 3.2 and other was measuring 3.2 x 4.8 x 3.2 cm.  The patient denies any change in bowel or bladder habits.  Denies any increase in abdominal girth.  She does have an increased CA-125 of 42.0.  The patient also states she has some occasional mid cycle spotting.  Her ultrasound was significant for 14 weeks size uterus and fibroids.  The uterus was found to be measuring 14.9 x 6.9 cm with largest fibroid being 7.5 x 6.7 cm.  However, the patient refused to have any treatment done to her fibroids such as myomectomy or Lupron at this point in time.  PAST MEDICAL HISTORY:         1. Enlarged thyroid nodule which is currently                                  being followed up by an ENT doctor.  She had                                  a needle biopsy and was found to be negative.                               2. She also is hypothyroid, and this is                                  currently being taken care of by her                                  physician, Shaune Pollack, M.D.                               3. Fibroids.  PAST SURGICAL HISTORY:        Needle biopsy of the thyroid.  MEDICATIONS:                   None.  ALLERGIES:                    The patient had no known drug allergies.  SOCIAL HISTORY:  Negative x 3.  PHYSICAL EXAMINATION:  VITAL SIGNS:                  The last time she was seen in the office was December 09, 2001, and blood pressure was 130/80.  Weight is 146 pounds.  GENERAL:                      The patient is in no apparent distress.  LABORATORY DATA:              Her last ultrasound on December 02, 2001, was consistent with the findings mentioned above.  The patient was also noted to have subserosal fibroids on ultrasound, the largest measuring 7.9 x 6.2 x 6.4. The patients CA-125 is 42 which is slightly elevated.  The patient also had a CT scan which was significant for the ovarian cysts with low attenuation, consistent with fluid-filled cysts and no omental caking or adenopathy to suggest an ovarian cancer.  ASSESSMENT:                   1. Enlarged ovarian cysts with increased CA-125.                               2. Metrorrhagia.                               3. Right-sided thyroid nodule.  PLAN:                         The patient has agreed to diagnostic laparoscopy with ovarian cystectomy.  The patient also agreed to a D&C hysteroscopy just for evaluation; however, does not want any treatment for her fibroids which would include myomectomy or Lupron, even though she is trying to preserve her fertility and trying to get pregnant and was told that she has a high rate of spontaneous abortion, as they are pedunculated fibroids and may not even be able to conceive, but the patient just wants a diagnostic hysteroscopy, no resection done.  The patient is to follow up with her primary care doctor for her thyroid.  The patient understands the risks of the procedure are but not limited to bleeding, infection, perforation of the uterus, damage to internal organs such as bowel, bladder, ureters, and major blood vessels. Dictated by:   Pierre Bali. Normand Sloop,  M.D. Attending:  Naima A. Dillard, M.D. DD:  02/06/02 TD:  02/06/02 Job: [id number]

## 2011-04-21 NOTE — Op Note (Signed)
NAMEAUDRA, [name]      ACCOUNT NO.:  [id number]   MEDICAL RECORD NO.:  [id number]          PATIENT TYPE:  OIB   LOCATION:  5738                         FACILITY:  [facility]   PHYSICIAN:  Angelia Mould. [name], M.D.DATE OF BIRTH:  06/13/1966   DATE OF PROCEDURE:  09/10/2006  DATE OF DISCHARGE:  09/11/2006                                 OPERATIVE REPORT   PREOPERATIVE DIAGNOSIS:  Solitary nodule, right thyroid lobe.   POSTOPERATIVE DIAGNOSIS:  Suspect hyperplastic nodule, right thyroid lobe.   OPERATION PERFORMED:  Right thyroid lobectomy with frozen section.   SURGEON:  Angelia Mould. [name], M.D.   FIRST ASSISTANT:  Ollen Gross. Carolynne Edouard, M.D.   OPERATIVE INDICATIONS:  This is a 45 year old black female who was evaluated  recently for an enlarging nodule of the right thyroid lobe.  She was  evaluated in 2003 at which time she had a 4.6 cm solid nodule of the right  thyroid lobe.  Fine needle aspiration cytology showed inflammatory process  but no malignant cells.  She has been on thyroid hormone.  Recent ultrasound  showed that the nodule on the right thyroid lobe had enlarged and basically  replaced the right thyroid lobe and measured 6.7 cm in greatest dimension.  She had a repeat fine needle aspiration cytology which showed follicular  epithelial cells and some lymphocytes and colloids, but no atypia.  There  was nothing wrong with the left thyroid lobe by ultrasound.  Because of  enlargement, despite the fact that she is on suppressive thyroid hormone,  she was advised to have surgery.  She is brought to the operating room  electively.   OPERATIVE TECHNIQUE:  Following the induction of general endotracheal  anesthesia, the patient was placed in a slightly reversed Trendelenburg  position with a roll behind her shoulders and her neck extended.  The neck  and upper chest were prepped and draped in a sterile fashion.  A curved  transverse incision was made in the lower neck  anteriorly, approximately 2  cm above the suprasternal notch.  Dissection was carried down through the  subcutaneous tissue and through the platysma muscle.  Skin and platysma  flaps were raised superiorly and inferiorly and a self-retaining retractor  placed.  The strap muscles were divided in the midline.  The nodule had  actually pushed the strap muscles over to the left somewhat.  We dissected  the strap muscles off of the thyroid gland on the right.  There was some  scar tissue causing adhesion of the strap muscles to the right thyroid  nodule focally.  It was postulated this might be due to previous biopsies.  We dissected the strap muscles off of the thyroid gland.  We slowly  mobilized up the lower pole and then the upper pole.  We isolated the upper  pole vessels by skeletonizing them with dissection, ligating them in  continuity with 2-0 silk ties, and then reinforcing the control with a  medium metal clip superiorly and dividing the superior thyroid vessels.  The  inferior thyroid lobe was examined and there were a few venous channels  which were isolated as they went onto  the thyroid gland, controlled metal  clips, and divided.  We dissected the right thyroid lobe from lateral to  medial staying right in the capsule of the thyroid gland and dissecting the  soft tissues away from the thyroid gland.  The inferior thyroid artery and  the middle thyroid vein were isolated, controlled with metal clips, and  divided.  We identified the recurrent laryngeal nerve on the right and it  was actually quite straight forward to stay up and away from that and  preserve the recurrent laryngeal nerve.  We continued the dissection up and  off of the trachea and took the dissection of the thyroid over to the left-  hand side of the trachea.  We placed two hemostats on the left side of the  isthmus and then divided the thyroid gland.  The stumps of the isthmus of  the thyroid were then suture  ligated with 3-0 Vicryl suture ligatures.   Frozen section diagnosis was performed by Dr. Laureen Ochs.  He said that this was  most likely a benign process, either a hyperplastic nodule or perhaps, a  follicular adenoma.  There did not appear to be any reason to take any more  thyroid gland out.  The wound was irrigated with saline.  Hemostasis was  excellent.  Some Surgicel gauze was placed in the bed of the thyroid and it  was actually left there for about 15 minutes and was completely dry.  We  left the Surgicel gauze in place.  The strap muscles were closed in the  midline with interrupted sutures of 3-0 Vicryl.  The platysma muscle was  reapproximated with interrupted sutures of 3-0 Vicryl.  The skin was closed  was a few skin staples and Steri-Strips.  Clean bandages were placed and the  patient was taken to the recovery room in stable condition.  Estimated blood  loss was about 25 mL.  Complications were none.  Sponge and instrument  counts were correct.      Angelia Mould. [name], M.D.  Electronically Signed     HMI/MEDQ  D:  09/10/2006  T:  09/11/2006  Job:  [id number]   cc:   Brooke Bonito, M.D.  Duncan Dull, M.D.

## 2011-04-21 NOTE — H&P (Signed)
NAME:  [name], [name] NO.:  [id number]   MEDICAL RECORD NO.:  [id number]                   PATIENT TYPE:  INP   LOCATION:  9169                                 FACILITY:  [facility]   PHYSICIAN:  Osborn Coho, M.D.                DATE OF BIRTH:  1966-08-27   DATE OF ADMISSION:  07/15/2004  DATE OF DISCHARGE:                                HISTORY & PHYSICAL   HISTORY OF PRESENT ILLNESS:  Ms. [name] is a 45 year old married  black female gravida 1, para 0 at 39-1/7 weeks who presents complaining  leaking large amounts of yellow fluid since about 11 o'clock this morning.  She denies feeling any regular uterine contractions.  She denies any nausea,  vomiting, headaches, or visual disturbances.  She does report having had a  small amount of bloody discharge last night for which she was seen earlier  today at [facility] and was at that time 1 cm, 50% and vertex.  She subsequently returned to work and then had this sudden gush of fluid  that has persisted.  Her pregnancy has been followed at [facility] by the MD service and has been at risk for (1) a history of a myomectomy  in May 2003 with no endometrial cavity entry and is okay for vaginal  delivery, complication (2) is a history of possible hypothyroidism and  thyroid enlargement however, all her TSH's have been normal throughout this  pregnancy, (3) she is over age 44 and declined amniocentesis with this  pregnancy, (4) her group B strep is positive.   OBSTETRICAL/GYNECOLOGICAL HISTORY:  She is a gravida 1, para 0 with an LMP  of October 18, 2003 giving her an [facility] of July 25, 2004 confirmed by  ultrasound giving ultrasound dating of July 21, 2004.  She has a history  of myomectomy in May 2003 where she had three fibroids removed and a cyst on  her left ovary removed.  She had an HSG also in 2003 and had a laparoscopy  and D&C in March 2003.   GENERAL MEDICAL HISTORY:   She has no known drug allergies.  She reports  having had the usual childhood diseases.  She has a history of an enlarged  thyroid but has been followed by Dr. Juleen China and her TSH's have been normal  throughout this pregnancy.  She is due to follow up with him about 2 months  after she delivers.   SURGICAL HISTORY INCLUDES:  Wisdom teeth, laparoscopy, and myomectomy in  2003.   FAMILY HISTORY:  Maternal grandfather with congestive heart failure, aunts  and paternal grandmother with adult-onset diabetes, maternal grandfather  with hypothyroidism and maternal grandmother with breast cancer.   GENETIC HISTORY:  Her genetic history is essentially negative though she is  over age 62 and declined an amniocentesis with this pregnancy.   SOCIAL HISTORY:  She is married to [name] who is involved and  supportive.  She is employed as full time as a Engineer, building services and he is  employed full time as a Runner, broadcasting/film/video.  They are both college educated.  They are  of the [employer].  They deny any illicit drug use, alcohol, or smoking  with this pregnancy.   PRENATAL LABORATORIES:  Her blood type is B positive, antibody screen is  negative, sickle cell trait is negative, syphilis is nonreactive, rubella is  immune, hepatitis B surface antigen is negative, GC and Chlamydia are both  negative, cystic fibrosis is negative, her group B strep was positive at 36  weeks and her 1-hour glucola was somewhat elevated and she required a 3-hour  GTT that was within normal range with a fasting of 78, a 1 hour of 131, a 2  hour of 113 and a 3 hour of 116.   PHYSICAL EXAMINATION:  VITAL SIGNS:  Her vital signs are stable.  She is  afebrile.  HEENT:  Her HEENT is grossly within normal limits.  HEART:  Her heart is regular rhythm/rate.  CHEST:  Her chest is clear.  BREASTS:  Breasts are soft and nontender.  ABDOMEN:  Her abdomen is gravid with uterine contractions that are every 3-5  minutes and mild.  Her  fetal heart rate is reactive and reassuring.  PELVIC:  Her pelvic exam at the office today earlier was 1 cm, 50%, vertex,  and further exam was deferred at this time.  She does have copious moderate  yellow meconium fluid noted.  EXTREMITIES:  Her extremities are within normal limits.   ASSESSMENT:  1. Intrauterine pregnancy at term.  2. Spontaneous rupture of the membranes with meconium-stained fluid.  3. Positive group B streptococcus.   PLAN:  Her plan is to admit to labor and delivery with routine MD orders, to  plan her penicillin for group B strep and to notify Dr. Su Hilt of patient's  admission and she will follow patient.     Concha Pyo. Duplantis, C.N.M.              Osborn Coho, M.D.    SJD/MEDQ  D:  07/15/2004  T:  07/15/2004  Job:  [id number]

## 2011-04-21 NOTE — Discharge Summary (Signed)
[name], [name]      ACCOUNT NO.:  [id number]   MEDICAL RECORD NO.:  [id number]          PATIENT TYPE:  INP   LOCATION:  9145                          FACILITY:  [facility]   PHYSICIAN:  Naima A. Dillard, M.D. DATE OF BIRTH:  20-Dec-1965   DATE OF ADMISSION:  07/15/2004  DATE OF DISCHARGE:  07/20/2004                                 DISCHARGE SUMMARY   ADMITTING DIAGNOSES:  1.  Intrauterine pregnancy at term.  2.  Spontaneous rupture of membranes with meconium-stained fluid.  3.  Positive group B streptococcus.   PREOPERATIVE DIAGNOSES:  1.  Intrauterine pregnancy at term.  2.  Premature rupture of membranes.  3.  Failure to progress.  4.  Chorioamnionitis.   PROCEDURE:  Primary low transverse cesarean section.   DISCHARGE DIAGNOSES:  1.  Intrauterine pregnancy at term.  2.  Spontaneous rupture of membranes with meconium-stained fluid.  3.  Positive group B streptococcus.  4.  Premature rupture of membranes.  5.  Failure to progress.  6.  Chorioamnionitis.  7.  Primary low transverse cesarean section.  8.  Anemia.   [name] is a 45 year old gravida 1 para 0 who presented at [redacted]  weeks gestation with premature rupture of membranes.  Induction of labor was  begun with Pitocin and the patient remained afebrile, with fetal heart tones  reassuring throughout August 12 and July 16, 2004.  On the morning of  August 14, the patient was noted to be 3-4 cm dilated.  Fetal heart tones  remained reassuring with positive beat-to-beat variability and positive  accelerations with occasional variable decelerations.  The patient remained  at 3-4 cm and at 3 p.m. the patient was reviewed options again by Dr. Osborn Coho including proceeding to C-section or continuing induction of labor  with no signs of symptoms of infection.  At 4:15 p.m. on July 17, 2004  fetal heart rate baseline began increasing.  The patient's temperature rose  to 100.2.  The options of  proceeding to C-section in light of developing  chorioamnionitis were discussed and the patient was now agreeable to proceed  with cesarean section.  This was performed on July 17, 2004 by Dr. Osborn Coho with the birth of a 7-pound 10-ounce female infant with Apgar scores  of 8 at one minute and 8 at five minutes.  Hemoglobin on postoperative day  #1 was 8.5.  The patient declined blood transfusion and was asymptomatic  regarding her anemia.  She continued to have a temperature in the normal  range.  However, on postoperative day #3, Dr. Normand Sloop saw the patient and  said status post C-section with low-grade temperature and a CBC with a left  shift suggestive of possible infection.  Recommendation was that the patient  stay and get IV antibiotics.  However, she declined and made the decision to  discharge home.  She was given prescriptions on  discharge for Augmentin, iron, Tylox, and Motrin.  She was given  instructions to call for a fever over 100.4; any pain; any nausea or  vomiting; any headache, visual changes, or epigastric pain; or any specific  problems or concerns.  SDM/MEDQ  D:  09/18/2004  T:  09/19/2004  Job:  [id number]

## 2011-04-21 NOTE — Op Note (Signed)
[facility] [address]  Patient:    [name], [name] Visit Number: [id number] MRN: [id number]          Service Type: GYN Location: 9300 [id number] Attending Physician:  Jaymes Graff A Dictated by:   Pierre Bali. Normand Sloop, M.D. Proc. Date: 04/14/02 Admit Date:  04/14/2002                             Operative Report  PREOPERATIVE DIAGNOSES:       1. Uterine fibroids.                               2. Left ovarian cyst.                               3. Metrorrhagia.                               4. Primary infertility.                               5. Left thigh lesion.  POSTOPERATIVE DIAGNOSES:      1. Uterine fibroids.                               2. Left ovarian endometrioma and fibroma.                               3. Metrorrhagia.                               4. Infertility.                               5. Right ovarian cyst.                               6. Left thigh lesion consistent with lipoma.  PROCEDURE:                    1. Multiple uterine myomectomy.                               2. Left ovarian cystectomy with removal of left                                  ovarian fibroma.                               3. Removal of inner thigh lesion.  SURGEON:                      Naima A. Normand Sloop, M.D.  ASSISTANT:                    Maris Berger. Pennie Rushing, M.D.  ESTIMATED BLOOD LOSS:  400 cc.  INTRAVENOUS FLUIDS:           2500 cc.  URINE OUTPUT:                 400 cc clear urine.  COMPLICATIONS:                None.  FINDINGS:                     A 14 week sized irregular sized uterus.  There was a right pedunculated fibroid just above the tube and round ligament insertion.  There was a fundal fibroid and a posterior uterine fibroid.  There was a right ovarian simple cyst, left ovarian fibroma and endometrioma. Otherwise, normal abdominal anatomy.  DISPOSITION:                  Patient went to recovery room in  stable condition.  PROCEDURE IN DETAIL:          Patient was taken to the operating room where she was placed in the dorsal supine position.  Given general anesthesia. Prepped and draped in a normal sterile fashion.  A Pfannenstiel skin incision was then made with a knife and carried down to the fascia.  The fascia was then incised in the midline and extended bilaterally using Bovie cautery.  The superior aspect of the fascia was dissected off the rectus muscles both sharply and bluntly using Bovie cautery.  The inferior aspect of the rectus muscles was dissected off the fascia in a similar fashion.  The rectus muscles were separated in the midline.  The peritoneum was identified, tented up, and entered sharply with Metzenbaum scissors.  Peritoneal incision was extended superiorly and inferiorly with good visualization of bowel and bladder. Peritoneal washings were obtained.  The OConnor-O-Sullivan was then placed into the abdomen without difficulty.  The bowel was packed away with moist laparotomy sponges.  The uterus was then elevated out of the abdominal cavity. The left ovary was adherent to the left pelvic side wall along with the left part of the uterus.  The right pedunculated fibroid was seen and it was just above the insertion of the round ligament.  There was a large fundal fibroid and a large posterior fibroid which was midline.  The patients right ovary was slightly enlarged.  Both tubes were normal bilaterally.  First, the right pedunculated fibroid Vasopressor was injected on the serosa.  Bovie cautery was then used to interrupt the serosa and the fibroid was both bluntly and sharply dissected using Bovie cautery and blunt dissection.  Fibroid was removed without difficulty.  Any bleeding areas were made hemostatic using Bovie.  Pin point dissection that we made careful assessment and the tube around ligament was not interrupted.  The dead space was closed with purse string  suture with 2-0 Vicryl.  The serosa was closed in a baseball stitch. Hemostasis was noted.  Attention was then turned to the fundal fibroid where vasopressin was injected.  Serosa was then interrupted using Bovie cautery and the fibroid was removed using blunt and sharp dissection along the serosa. Tenaculum was placed in the fibroid and the fibroid was easily removed.  The dead space was closed with figure-of-eight stitches using 0 Vicryl and 2-0 Vicryl.  Hemostasis was assured.  The posterior fibroid was so far away from the fundal fibroid that a third incision was decided to be made posteriorly. The vasopressin was then injected and the posterior fibroid was slightly  deeper than the other two so the myometrium was also dissected.  The fibroid was removed using blunt and sharp dissection.  After probing there was no entry into the endometrial cavity.  Because there was no bleeding, Allis clamps were then placed on this posterior fibroid area.  There was a hard nodule on the left ovary which was removed.  It was clamped with Tresa Endo, removed with a knife, and suture ligated.  Hemostasis was assured.  Sent to pathology for frozen section.  It was also noted that the patient had an endometrioma.  Because the cyst was ruptured, chocolate fluid was noted to come out.  The ovarian capsule was then transected using Bovie cautery and the cyst wall was removed both bluntly and sharply also using hydrodissection with lactated Ringers.  Hemostasis was noted.  The ovary was reapproximated using 2-0 Vicryl in a running locked fashion along with a figure-of-eight stitch in the middle of the ovary to reapproximate the ovarian tissues for hemostasis. Hemostasis was assured.  The posterior area dead space was closed in a figure-of-eight stitch using 0 Vicryl.  Hemostasis was assured.  The uterine serosa was closed using a baseball stitch with 3-0 Vicryl.  Hemostasis was assured.  The extra serosa was excised  using Metzenbaum scissors and the serosa was reapproximated using a baseball stitch.  Hemostasis was assured. The abdomen was irrigated with copious irrigation and again hemostasis was  assured.  The right ovary was noted to be a little bit enlarged so a syringe was placed into the cyst on the ovary which had straw colored fluid which was consistent with a simple cyst.  Interseed was then placed on the posterior aspect of the uterus along the incision.  The uterus was placed in its normal capacity.  All instruments were removed from the abdomen.  The peritoneum was closed with 2-0 Vicryl.  The fascia was closed with 0 Vicryl in a running fashion.  Subcutaneous tissue was irrigated with saline.  All bleeding areas were made hemostatic with Bovie cautery.  The skin was closed with staples. Sponge, lap, and needle counts were correct x2.  Attention was then turned to the inner thigh where she had the nodule that she wanted me to remove.  An elliptical incision was made with the 15 blade knife only about 1.5 cm long and the nodule was removed with a shaving kind of effect using the knife.  The bed was made hemostatic using Bovie cautery.  The skin was reapproximated using 3-0 chromic in an interrupted fashion.  Hemostasis was noted.  Sponge, lap, and needle counts were correct x2.  Patient went to recovery room in stable condition. Dictated by:   Pierre Bali. Normand Sloop, M.D. Attending Physician:  Michael Litter DD:  04/14/02 TD:  04/15/02 Job: [id number]

## 2011-04-21 NOTE — Op Note (Signed)
NAMESKII, CLELAND      ACCOUNT NO.:  [id number]   MEDICAL RECORD NO.:  [id number]          PATIENT TYPE:  OIB   LOCATION:  5703                         FACILITY:  [facility]   PHYSICIAN:  Angelia Mould. [name], M.D.DATE OF BIRTH:  [birth year]   DATE OF PROCEDURE:  11/06/2006  DATE OF DISCHARGE:                               OPERATIVE REPORT   PREOPERATIVE DIAGNOSIS:  Multifocal papillary carcinoma of the thyroid.   POSTOPERATIVE DIAGNOSIS:  Multifocal papillary carcinoma of the thyroid.   OPERATION PERFORMED:  Left thyroid lobectomy (completion thyroidectomy).   SURGEON:  Angelia Mould. [name], M.D.   ASSISTANT:  Adolph Pollack, M.D.   OPERATIVE INDICATIONS:  This is a 45 year old black female who has been  followed for some time for a large solid nodule of the right thyroid  lobe.  She ultimately underwent right thyroid lobectomy with frozen  section on September 10, 2006.  Frozen section was felt to be consistent  with a benign process, either a hyperplastic nodule or perhaps a  follicular adenoma.  The specimen was sent for a second opinion to the  [facility] and the final report revealed a follicular  variant of papillary carcinoma, multifocal within an encapsulated  nodule.  Negative surgical margins of resection.  It was felt that it  would be in the patient's best interest to undergo completion  thyroidectomy and postop radiation iodine ablation.  This has been  discussed with the patient and she is in agreement.  She is brought to  operating room electively.   OPERATIVE TECHNIQUE:  Following induction of general endotracheal  anesthesia, the patient was placed in reverse Trendelenburg position  with the neck extended.  The neck was then prepped and draped in sterile  fashion.  The patient was identified as to correct procedure, correct  site and correct patient.  A curved transverse incision was made in the  anterior neck through the previous scar.   Dissection was carried down  through scar tissue, through the subcutaneous tissue and through the  platysma muscle.  Skin and platysma flaps were raised superiorly and  inferiorly.  Small vessels were coagulated with electrocautery.  Self-  retaining retractor was placed.  We could palpate the innominate artery,  traversing the lower end of the incision.  It seemed to come up a little  bit higher into the neck than usual.  We very carefully divided the  strap muscles and identified the thyroid gland on the left side.  Using  sharp and blunt dissection we dissected strap muscles off of the thyroid  gland.  Middle thyroid vein was identified, controlled with multiple  metal clips and divided.  Some smaller inferior pole vessels were  controlled metal clips and divided.  We took the dissection up to this  superior pole and we isolated the superior thyroid vessels.  We  skeletonized this to be sure we were not taking any parathyroid tissue.  We then ligated superior thyroid vessels in continuity with 2-0 silk  ties and placed a metal clip above the most superior tie for security  and then divided the vessels.  We then dissected the superior pole  away  from the underlying tissues.  We very carefully dissected the left  thyroid lobe from lateral to medial.  The lobe was relatively small and  was up off of the esophagus without any difficulty and so we stayed  right in the capsule of thyroid gland and dissected down.  Inferior  thyroid artery was tiny, isolated between metal clips and divided.  We  identified the region of the recurrent laryngeal nerve and it was so far  posterior as to not come into play in the dissection.  We felt that we  identified the inferior parathyroid gland and left that intact.  We  continued the dissection from lateral to medial taking the left thyroid  lobe and the remnant of the isthmus up off the trachea and the specimen  was sent for routine histology with the  clinical history attached.  The  wound was irrigated with saline.  We placed some Surgicel gauze in the  bed of the thyroid and packed this off for about 5 minutes and when we  reexamined this area, it was completely dry.  We left the Surgicel in  place.  The strap muscles were closed in the midline with interrupted  sutures of 3-0 Vicryl.  Platysma muscle was closed with interrupted  sutures of 3-0 Vicryl.  Skin was closed with three skin staples and then  Steri-Strips.  Clean bandages were placed and the patient taken to  recovery room in stable condition.  Estimated blood loss was about 20  mL.  Complications none.  Sponge, needle and instrument counts were  correct.      Angelia Mould. [name], M.D.  Electronically Signed     HMI/MEDQ  D:  11/06/2006  T:  11/06/2006  Job:  [id number]   cc:   Brooke Bonito, M.D.  Duncan Dull, M.D.

## 2011-04-21 NOTE — H&P (Signed)
[facility]  Patient:    [name], [name] Visit Number: [id number] MRN: [id number]          Service Type: GYN Location: 9300 [id number] Attending Physician:  Jaymes Graff A Dictated by:   Pierre Bali. Normand Sloop, M.D. Admit Date:  04/14/2002                           History and Physical  HISTORY OF PRESENT ILLNESS:   The patient is a 45 year old African-American female, G0, who presented to me for evaluation for ovarian cyst and mid-cycle spotting along with fibroids.  The patient was noted to have an ovarian cyst on ultrasound since October 2002 and, despite several ultrasounds, the ovarian cysts have not resolved.  The left ovary measures 3.9 x 6.5 x 6.1 cm with two complex cysts consistent with either endometrioma or hemorrhagic cysts.  The patient denies having any bowel or bladder changes in their habits.  She also denies any increase in abdominal girth.  She did have increased OC at 125 which was stable at 42.  The patient also stated that she had mid cycle spotting for the past six months.  Her ultrasound was significant for a 14-week-sized uterus with a fibroid uterus that was found to be measured at 14.9 x 6.9 length with the largest fibroid 7.5 x 6.7 cm.  The patient was taken to the operating room for a laparoscopic removal of a left ovarian cyst on February 07, 2002.  However, she had continued adhesions on the side of the cyst and dense adhesions of the pelvic side wall.  We were unable to attempt this easily without doing a laparotomy.  Because the patient has fibroids and has been trying to conceive for over a year with a normal HSG, normal labs, I offered her an exploratory laparotomy, ovarian cystectomy, and myomectomy, and patient consented to myomectomy.  She did have a D&C/hysteroscopy at the time of laparoscopy.  The only thing that was noted was a submucosal component of her fibroids.  However, on her HSG, they did see some small  filling defects significant for maybe what would be submucosal fibroids.  However, on her HSG, they did see some small filling defects significant for maybe what would be submucosal fibroids.  These, however, were very small and they were unable to be seen on D&C/hysteroscopy.  PAST MEDICAL HISTORY:         Patient has a large thyroid nodule which is currently being followed by the ENT doctor.  She is also hypothyroid. However, a TSH has been within normal limits and she is managed by Shaune Pollack, M.D.  She also has a history of fibroids and unexplained primary infertility.  The patients husband has not had a semen analysis yet and she understands that the infertility could be a result of an abnormal semen analysis; however, the patient still wants to proceed with myomectomy and left cystectomy.  PAST SURGICAL HISTORY:        Significant for needle biopsy of the thyroid and diagnostic laparoscopy.  MEDICATIONS:                  None.  ALLERGIES:                    Patient has no known drug allergies.  SOCIAL HISTORY:               Negative x 3.  PHYSICAL EXAMINATION:  VITAL SIGNS:                  Patient has a blood pressure of 100/66.  Her weight is 149-1/2 pounds.  ABDOMEN:                      Soft and nontender.  Incision of her laparoscopy is well healed.  PELVIC:                       Vulva and vaginal exam is within normal limits. Vagina is normal.  Cervix is nontender without lesions.  Cervix is about 14 to 16 weeks size and regular, nontender.  Adnexa has no masses.  Patient has one mid cycle spotting or metrorrhagia most likely from fibroids.  LABORATORY DATA:              Her pathology from her D&C/hysteroscopy showed benign proliferative endometrium, no hyperplasia.  She also had peritoneal washings which were negative for malignant cells.  Patient also has a persistent left ovarian cyst.  IMPRESSION AND PLAN:          She was offered a myomectomy, a left   ovarian cystectomy.  The risk of bleeding, infection, damage to internal organs such as bowel and bladder, and hysterectomy were reviewed with the patient and the patient agreed.  Patient also had stable hirsutism which had a full lab workup which was found to be normal.  We will repeat these labs per preoperative labs. Dictated by:   Pierre Bali. Normand Sloop, M.D. Attending Physician:  Michael Litter DD:  04/14/02 TD:  04/14/02 Job: [id number]

## 2011-04-21 NOTE — Discharge Summary (Signed)
[facility]  Patient:    [name], [name] Visit Number: [id number] MRN: [id number]          Service Type: GYN Location: [phone] Attending Physician:  Jaymes Graff A Dictated by:   Pierre Bali. Normand Sloop, M.D. Admit Date:  04/14/2002 Discharge Date: 04/16/2002                             Discharge Summary  DIAGNOSIS:                    Left ovarian cyst and symptomatic fibroids.  PROCEDURES:                   A myomectomy and a left ovarian cystectomy and endometriosis.  DISCHARGE MEDICATIONS:        1. Motrin.                               2. Percocet.                               3. Colace.  ACTIVITY:                     The patient is to remain on pelvic rest without any tampons, douching, or sex.  No driving and no heavy lifting above 10 pounds.  The patient is to call for a temperature greater than 100 or heavy vaginal bleeding, which told her was soaking more than one pad an hour.  FOLLOWUP:                     She has a followup visit with me on Apr 22, 2002 at 11:15 for staple and stitch removal.  HOSPITAL COURSE:              On Apr 14, 2002, the patient underwent exploratory laparotomy, myomectomy, left ovarian cystectomy, and removal of a left thigh lesion, consistent with a lipoma.  The patient did well.  On postoperative day #1, she was afebrile with stable vital signs.  Her hemoglobin stabilized at 9.6.  The patient was passing gas and tolerating p.o. Her incision was clean, dry, and intact and she was just given routine care. Postoperative day #2, her T-max was 100.2.  The patient still did incentive spirometry and ambulated.  She never developed a fever.  She was well-developed in no apparent distress.  Heart was regular.  Lungs were clear. Abdomen was soft and nontender.  The patient was sent home on postoperative day #2 with pelvic rest, Motrin and Percocet, and to followup with me on Apr 22, 2002. Dictated by:    Pierre Bali. Normand Sloop, M.D. Attending Physician:  Michael Litter DD:  04/16/02 TD:  04/18/02 Job: [id number]

## 2011-09-15 LAB — ANTI-THYROGLOBULIN ANTIBODY: Thyroglobulin Ab: <30 U/mL

## 2011-09-15 LAB — THYROGLOBULIN LEVEL

## 2011-09-18 LAB — HCG, SERUM, QUALITATIVE: Preg, Serum: NEGATIVE

## 2012-02-02 ENCOUNTER — Encounter (INDEPENDENT_AMBULATORY_CARE_PROVIDER_SITE_OTHER): Payer: Private Health Insurance - Indemnity | Admitting: Obstetrics and Gynecology

## 2012-02-02 ENCOUNTER — Other Ambulatory Visit: Payer: Private Health Insurance - Indemnity

## 2012-02-02 DIAGNOSIS — D259 Leiomyoma of uterus, unspecified: Secondary | ICD-10-CM

## 2012-02-02 DIAGNOSIS — N83209 Unspecified ovarian cyst, unspecified side: Secondary | ICD-10-CM

## 2012-02-14 ENCOUNTER — Other Ambulatory Visit: Payer: Self-pay | Admitting: Obstetrics and Gynecology

## 2012-02-14 DIAGNOSIS — Z1231 Encounter for screening mammogram for malignant neoplasm of breast: Secondary | ICD-10-CM

## 2012-03-15 ENCOUNTER — Ambulatory Visit
Admission: RE | Admit: 2012-03-15 | Discharge: 2012-03-15 | Disposition: A | Discharge status: Home / Self Care | Payer: Private Health Insurance - Indemnity | Source: Ambulatory Visit | Attending: Obstetrics and Gynecology | Admitting: Obstetrics and Gynecology

## 2012-03-15 DIAGNOSIS — Z1231 Encounter for screening mammogram for malignant neoplasm of breast: Secondary | ICD-10-CM

## 2012-06-07 ENCOUNTER — Telehealth: Payer: Self-pay | Admitting: Obstetrics and Gynecology

## 2012-06-11 ENCOUNTER — Other Ambulatory Visit: Payer: Self-pay

## 2012-06-11 DIAGNOSIS — N83209 Unspecified ovarian cyst, unspecified side: Secondary | ICD-10-CM

## 2012-06-11 NOTE — Telephone Encounter (Signed)
Spoke with pt rgd mag pt had several questions all questions andswered pt has appt 06/21/12 with ND and u/s f/u ovarian cyst pt voice understanding

## 2012-06-21 ENCOUNTER — Other Ambulatory Visit: Payer: Private Health Insurance - Indemnity

## 2012-06-21 ENCOUNTER — Encounter: Payer: Private Health Insurance - Indemnity | Admitting: Obstetrics and Gynecology

## 2012-07-29 ENCOUNTER — Telehealth: Payer: Self-pay | Admitting: Obstetrics and Gynecology

## 2012-07-29 NOTE — Telephone Encounter (Signed)
Pt can u/s with appt pt was to have one on 06/21/12 but canceled so she can have u/ f/u cyst

## 2012-09-05 ENCOUNTER — Ambulatory Visit: Payer: Private Health Insurance - Indemnity | Admitting: Obstetrics and Gynecology

## 2012-09-13 ENCOUNTER — Telehealth: Payer: Self-pay | Admitting: Obstetrics and Gynecology

## 2012-09-13 NOTE — Telephone Encounter (Signed)
ND pt 

## 2012-09-13 NOTE — Telephone Encounter (Signed)
Pt can have appt with u/s

## 2012-10-01 ENCOUNTER — Ambulatory Visit: Payer: Private Health Insurance - Indemnity | Admitting: Obstetrics and Gynecology

## 2012-10-18 ENCOUNTER — Encounter: Payer: Self-pay | Admitting: Obstetrics and Gynecology

## 2012-10-18 ENCOUNTER — Ambulatory Visit (INDEPENDENT_AMBULATORY_CARE_PROVIDER_SITE_OTHER): Payer: Private Health Insurance - Indemnity

## 2012-10-18 ENCOUNTER — Ambulatory Visit (INDEPENDENT_AMBULATORY_CARE_PROVIDER_SITE_OTHER): Payer: Private Health Insurance - Indemnity | Admitting: Obstetrics and Gynecology

## 2012-10-18 VITALS — BP 110/70 | Ht 63.0 in | Wt 157.0 lb

## 2012-10-18 DIAGNOSIS — Z01419 Encounter for gynecological examination (general) (routine) without abnormal findings: Secondary | ICD-10-CM

## 2012-10-18 DIAGNOSIS — D259 Leiomyoma of uterus, unspecified: Secondary | ICD-10-CM

## 2012-10-18 DIAGNOSIS — N83209 Unspecified ovarian cyst, unspecified side: Secondary | ICD-10-CM

## 2012-10-18 DIAGNOSIS — D219 Benign neoplasm of connective and other soft tissue, unspecified: Secondary | ICD-10-CM

## 2012-10-18 DIAGNOSIS — Z124 Encounter for screening for malignant neoplasm of cervix: Secondary | ICD-10-CM

## 2012-10-18 NOTE — Progress Notes (Signed)
Last Pap: 08/2011 WNL: Yes Regular Periods:yes Contraception: n/a  Monthly Breast exam:yes Tetanus<37yrs:yes Nl.Bladder Function:yes Daily BMs:yes Healthy Diet:yes Calcium:yes Mammogram:yes Date of Mammogram: 02/2012 wnl per pt Exercise:yes Have often Exercise: walk every day Seatbelt: yes Abuse at home: no Stressful work:no Sigmoid-colonoscopy: n/a Bone Density: No PCP: Dr. Kevan Ny Change in PMH: no change Change in FMH:no change.vs BP 110/70  Ht 5\' 3"  (1.6 m)  Wt 157 lb (71.215 kg)  BMI 27.81 kg/m2  LMP 09/30/2012 Pt with complaints:no Physical Examination: General appearance - alert, well appearing, and in no distress Mental status - normal mood, behavior, speech, dress, motor activity, and thought processes Neck - supple, no significant adenopathy,  thyroid exam: thyroid is normal in size without nodules or tenderness Chest - clear to auscultation, no wheezes, rales or rhonchi, symmetric air entry Heart - normal rate and regular rhythm Abdomen - soft, nontender, nondistended, no masses or organomegaly Breasts - breasts appear normal, no suspicious masses, no skin or nipple changes or axillary nodes Pelvic - normal external genitalia, vulva, vagina, cervix, uterus and adnexa Rectal - normal rectal, no masses, rectal exam not indicated Back exam - full range of motion, no tenderness, palpable spasm or pain on motion Neurological - alert, oriented, normal speech, no focal findings or movement disorder noted Musculoskeletal - no joint tenderness, deformity or swelling Extremities - no edema, redness or tenderness in the calves or thighs Skin - normal coloration and turgor, no rashes, no suspicious skin lesions noted Routine exam Persistent ovarian cyst Pap sent yes Mammogram due no done 4/13 Natural Family Planning used for contraception Pt had [place] cyst unchanged.  She desires to observe .  Repeat US in 6 months RT 6 months

## 2012-10-19 DIAGNOSIS — N83209 Unspecified ovarian cyst, unspecified side: Secondary | ICD-10-CM | POA: Insufficient documentation

## 2012-10-19 DIAGNOSIS — D219 Benign neoplasm of connective and other soft tissue, unspecified: Secondary | ICD-10-CM | POA: Insufficient documentation

## 2012-10-21 LAB — PAP IG W/ RFLX HPV ASCU

## 2012-10-22 ENCOUNTER — Other Ambulatory Visit: Payer: Self-pay | Admitting: Obstetrics and Gynecology

## 2012-10-22 DIAGNOSIS — N83209 Unspecified ovarian cyst, unspecified side: Secondary | ICD-10-CM

## 2012-11-04 ENCOUNTER — Ambulatory Visit: Payer: Private Health Insurance - Indemnity | Admitting: Obstetrics and Gynecology

## 2012-11-06 ENCOUNTER — Other Ambulatory Visit (HOSPITAL_COMMUNITY): Payer: Self-pay | Admitting: Endocrinology

## 2012-11-06 DIAGNOSIS — C73 Malignant neoplasm of thyroid gland: Secondary | ICD-10-CM

## 2012-11-18 ENCOUNTER — Encounter (HOSPITAL_COMMUNITY)
Admission: RE | Admit: 2012-11-18 | Discharge: 2012-11-18 | Disposition: A | Discharge status: Home / Self Care | Payer: Private Health Insurance - Indemnity | Source: Ambulatory Visit | Attending: Endocrinology | Admitting: Endocrinology

## 2012-11-18 DIAGNOSIS — C73 Malignant neoplasm of thyroid gland: Secondary | ICD-10-CM

## 2012-11-18 MED ORDER — THYROTROPIN ALFA 1.1 MG IM SOLR
0.9000 mg | INTRAMUSCULAR | Status: AC
  Administered 2012-11-18: 0.9 mg via INTRAMUSCULAR
  Filled 2012-11-18: qty 0.9

## 2012-11-19 ENCOUNTER — Encounter (HOSPITAL_COMMUNITY)
Admission: RE | Admit: 2012-11-19 | Discharge: 2012-11-19 | Disposition: A | Discharge status: Home / Self Care | Payer: Private Health Insurance - Indemnity | Source: Ambulatory Visit | Attending: Endocrinology | Admitting: Endocrinology

## 2012-11-19 MED ORDER — THYROTROPIN ALFA 1.1 MG IM SOLR
0.9000 mg | INTRAMUSCULAR | Status: AC
  Administered 2012-11-19: 0.9 mg via INTRAMUSCULAR
  Filled 2012-11-19: qty 0.9

## 2012-11-20 ENCOUNTER — Encounter (HOSPITAL_COMMUNITY)
Admission: RE | Admit: 2012-11-20 | Discharge: 2012-11-20 | Disposition: A | Discharge status: Home / Self Care | Payer: Private Health Insurance - Indemnity | Source: Ambulatory Visit | Attending: Endocrinology | Admitting: Endocrinology

## 2012-11-22 ENCOUNTER — Encounter (HOSPITAL_COMMUNITY)
Admission: RE | Admit: 2012-11-22 | Discharge: 2012-11-22 | Disposition: A | Discharge status: Home / Self Care | Payer: Private Health Insurance - Indemnity | Source: Ambulatory Visit | Attending: Endocrinology | Admitting: Endocrinology

## 2012-11-22 MED ORDER — SODIUM IODIDE I 131 CAPSULE
4.0000 | Freq: Once | INTRAVENOUS | Status: AC | PRN
  Administered 2012-11-22: 4 via ORAL

## 2013-02-19 ENCOUNTER — Other Ambulatory Visit: Payer: Self-pay

## 2013-02-19 DIAGNOSIS — Z1231 Encounter for screening mammogram for malignant neoplasm of breast: Secondary | ICD-10-CM

## 2013-03-17 ENCOUNTER — Ambulatory Visit
Admission: RE | Admit: 2013-03-17 | Discharge: 2013-03-17 | Disposition: A | Discharge status: Home / Self Care | Payer: Private Health Insurance - Indemnity | Source: Ambulatory Visit

## 2013-03-17 DIAGNOSIS — Z1231 Encounter for screening mammogram for malignant neoplasm of breast: Secondary | ICD-10-CM

## 2013-09-15 ENCOUNTER — Other Ambulatory Visit: Payer: Self-pay | Admitting: Family Medicine

## 2013-09-15 ENCOUNTER — Ambulatory Visit
Admission: RE | Admit: 2013-09-15 | Discharge: 2013-09-15 | Disposition: A | Discharge status: Home / Self Care | Payer: Managed Care, Other (non HMO) | Source: Ambulatory Visit | Attending: Family Medicine | Admitting: Family Medicine

## 2013-09-15 DIAGNOSIS — R0781 Pleurodynia: Secondary | ICD-10-CM

## 2014-01-01 ENCOUNTER — Encounter: Payer: Self-pay | Admitting: Gynecologic Oncology

## 2014-01-01 ENCOUNTER — Ambulatory Visit: Payer: Managed Care, Other (non HMO) | Attending: Gynecologic Oncology | Admitting: Gynecologic Oncology

## 2014-01-01 VITALS — BP 132/82 | HR 76 | Temp 98.7°F | Resp 16 | Ht 62.17 in | Wt 161.9 lb

## 2014-01-01 DIAGNOSIS — D259 Leiomyoma of uterus, unspecified: Secondary | ICD-10-CM | POA: Insufficient documentation

## 2014-01-01 DIAGNOSIS — Z79899 Other long term (current) drug therapy: Secondary | ICD-10-CM | POA: Insufficient documentation

## 2014-01-01 DIAGNOSIS — N9489 Other specified conditions associated with female genital organs and menstrual cycle: Secondary | ICD-10-CM | POA: Insufficient documentation

## 2014-01-01 DIAGNOSIS — N83209 Unspecified ovarian cyst, unspecified side: Secondary | ICD-10-CM | POA: Insufficient documentation

## 2014-01-01 DIAGNOSIS — L680 Hirsutism: Secondary | ICD-10-CM | POA: Insufficient documentation

## 2014-01-01 DIAGNOSIS — N809 Endometriosis, unspecified: Secondary | ICD-10-CM | POA: Insufficient documentation

## 2014-01-01 DIAGNOSIS — R971 Elevated cancer antigen 125 [CA 125]: Secondary | ICD-10-CM | POA: Insufficient documentation

## 2014-01-01 DIAGNOSIS — Z803 Family history of malignant neoplasm of breast: Secondary | ICD-10-CM | POA: Insufficient documentation

## 2014-01-01 DIAGNOSIS — E039 Hypothyroidism, unspecified: Secondary | ICD-10-CM | POA: Insufficient documentation

## 2014-01-01 NOTE — Patient Instructions (Signed)
Follow up with Dr. Charlesetta Garibaldi for surgery and GYN Oncology as needed in the future.  Please call for any questions or concerns.  Ovarian Cyst An ovarian cyst is a sac filled with fluid or blood. This sac is attached to the ovary. Some cysts go away on their own. Other cysts need treatment.  HOME CARE   Only take medicine as told by your doctor.  Follow up with your doctor as told.  Get regular pelvic exams and Pap tests. GET HELP IF:  Your periods are late, not regular, or painful.  You stop having periods.  Your belly (abdominal) or pelvic pain does not go away.  Your belly becomes large or puffy (swollen).  You have a hard time peeing (totally emptying your bladder).  You have pressure on your bladder.  You have pain during sex.  You feel fullness, pressure, or discomfort in your belly.  You lose weight for no reason.  You feel sick most of the time.  You have a hard time pooping (constipation).  You do not feel like eating.  You develop pimples (acne).  You have an increase in hair on your body and face.  You are gaining weight for no reason.  You think you are pregnant. GET HELP RIGHT AWAY IF:   Your belly pain gets worse.  You feel sick to your stomach (nauseous), and you throw up (vomit).  You have a fever that comes on fast.  You have belly pain while pooping (bowel movement).  Your periods are heavier than usual. MAKE SURE YOU:   Understand these instructions.  Will watch your condition.  Will get help right away if you are not doing well or get worse. Document Released: 05/08/2008 Document Revised: 09/10/2013 Document Reviewed: 07/28/2013 [facility] Patient Information 2014 [place].

## 2014-01-01 NOTE — Progress Notes (Signed)
Consult Note: Gyn-Onc  Consult was requested by Dr. Elray Mcgregor for the evaluation of [name] 48 y.o. female  CC:  Chief Complaint  Patient presents with  . Adnexal Mass    New Consult    Assessment/Plan:  Carrie. [name]  is a 48 y.o.  with a left adnexal mass that has recently increased in size and now measures 10.6 x 6.9 x 4.5 cm. CA125 is mildly elevated.    I recommended left salpingo-oophorectomy. She is aware that there is a very small chance that this may be a malignancy but if it is Gyn Onc would be immediately available to evaluate the next necessary steps.  With regard to facial hirsutism she states that she shaves before every visit with Dr. Charlesetta Garibaldi.  I  have encouraged her to permit Dr. Charlesetta Garibaldi to begin a hirsutism workup.  I am aware that [name]  has a followup appointment with Dr. Charlesetta Garibaldi in 2 weeks at which time she plans to schedule the surgical procedure.   HPI: [name] is a 48 year old gravida 1 para 1 last normal menstrual period 12/23/2013. She reports that a myomectomy and left ovarian cystectomy were performed in 2003.  Review of the pathology is notable for the presence of endometriosis. She states that the left-sided ovarian cyst recurred and it has been followed closely with ultrasounds over the last 4 years and has remained stable in size. During that interval she felt able to manage the cyst  with juicing an exercise. Ultrasound in November of 2013 was notable for a left ovary measuring 8.56 x 5.96 x 4.87 cm with low level echoes with the appearance of an endometrioma. Ultrasound on 12/11/2013 was notable for the left adnexa measuring 10.6 x 6.9 x 4.5 cm CA 125 returned with a value of 40.2. The patient presents for recommendations regarding management.  She denies any history of abdominal pain she states she has good appetite, reports 10 pound weight loss over the last year.  There  has been no nausea or vomiting, no  abnormal bloating there is some discomfort associated with menses. She also notes long-standing facial hirsutism which she manages with regular shaving. Her family history is notable for maternal grandmother with breast cancer diagnosed at age 80.  She is not at this time ready to completely eliminate the possibility of having anymore children.  Current Meds:  Outpatient Encounter Prescriptions as of 01/01/2014  Medication Sig  . Ascorbic Acid (VITAMIN C) 1000 MG tablet Take 2,000 mg by mouth.  . cholecalciferol (VITAMIN D) 1000 UNITS tablet Take 1,000 Units by mouth daily. Takes Vitamin D3,  . levothyroxine (SYNTHROID, LEVOTHROID) 200 MCG tablet Take 200 mcg by mouth daily.    Allergy: No Known Allergies  Social Hx:   History   Social History  . Marital Status: Married    Spouse Name: N/A    Number of Children: N/A  . Years of Education: N/A   Occupational History  . Not on file.   Social History Main Topics  . Smoking status: Never Smoker   . Smokeless tobacco: Not on file  . Alcohol Use: No  . Drug Use: No  . Sexual Activity: Yes   Other Topics Concern  . Not on file   Social History Narrative  . No narrative on file    Past Surgical Hx:  Past Surgical History  Procedure Laterality Date  . Myomectomy    . Hysteroscopy w/d&c  02/07/02  . Laparoscopy    .  Wisdom tooth extraction      Past Medical Hx:  Past Medical History  Diagnosis Date  . Fibroids   . Ovarian cyst, left     persistent   . Endometriosis   . Mesenteric cyst 2010  . Breast fibroadenoma     2009  . Thyroid goiter   . Hypothyroid     Past Gynecological History: Gravida 1 para 1 last normal menstrual period 12/31/2013 no history of abnormal Pap has never used birth control menarche occurred at age of 69 with regular menses every 28 days lasting 5-6 days. Cesarean section x1.  Family Hx:  Family History  Problem Relation Age of Onset  . Diabetes Paternal Aunt   . Heart disease Maternal  Grandfather     CHF   . Diabetes Paternal Grandmother     Review of Systems:  Constitutional  Feels well,   Skin  Shaves very frequently  Cardiovascular  No chest pain, shortness of breath, or edema  Pulmonary  No cough or wheeze.  Gastro Intestinal  No nausea, vomitting, or diarrhoea. No bright red blood per rectum, no abdominal pain, change in bowel movement, or constipation.  Genito Urinary  No frequency, urgency, dysuria, Musculo Skeletal  No myalgia, arthralgia, joint swelling or pain  Neurologic  No weakness, numbness, change in gait,  Psychology  No depression, anxiety, insomnia.   Vitals:  Blood pressure 132/82, pulse 76, temperature 98.7 F (37.1 C), temperature source Oral, resp. rate 16, height 5' 2.17" (1.579 m), weight 161 lb 14.4 oz (73.437 kg).  Physical Exam: WD in NAD Neck  Supple NROM, without any enlargements.  Lymph Node Survey No cervical supraclavicular or inguinal adenopathy Cardiovascular  Pulse normal rate, regularity and rhythm. S1 and S2 normal.  Lungs  Clear to auscultation bilateraly, Good air movement.  Skin  Significant facial hirsutism.  Female escutcheon, coarse hair of the legs  Psychiatry  Alert and oriented normal mood affect and speech Abdomen  Normoactive bowel sounds, abdomen soft, non-tender and obese. Surgical  sites intact without evidence of hernia.  Back No CVA tenderness Genito Urinary  Vulva/vagina: Normal external female genitalia.  No lesions. No clitoramegaly  Bladder/urethra:  No lesions or masses  Vagina:c/w menses  Cervix: Normal appearing, no lesions.  Uterus: Unable to assess  no parametrial involvement or nodularity.  Adnexa: No palpable masses. Rectal  Good tone, no masses no cul de sac nodularity.  Extremities  No bilateral cyanosis, clubbing or edema.   Janie Morning, MD, PhD 01/01/2014, 5:51 PM

## 2014-03-05 ENCOUNTER — Encounter (HOSPITAL_COMMUNITY): Payer: Self-pay | Admitting: Pharmacist

## 2014-03-09 NOTE — Patient Instructions (Addendum)
   Your procedure is scheduled on:  Tuesday, April 14  Enter through the [employer] [facility] at:  [place] up the phone at the desk and dial [phone] and inform us of your arrival.  Please call this number if you have any problems the morning of surgery: [phone]  Remember: Do not eat food after midnight: Monday Do not drink clear liquids after: 9 AM Tuesday, day of surgery Take these medicines the morning of surgery with a SIP OF WATER:  Thyroid med  Do not wear jewelry, make-up, or FINGER nail polish No metal in your hair or on your body. Do not wear lotions, powders, perfumes.  You may wear deodorant.  Do not bring valuables to the hospital. Contacts, dentures or bridgework may not be worn into surgery.  Leave suitcase in the car. After Surgery it may be brought to your room. For patients being admitted to the hospital, checkout time is 11:00am the day of discharge.  Home with husband [name] cell [phone] or mother [name] cell [phone].

## 2014-03-10 ENCOUNTER — Encounter (HOSPITAL_COMMUNITY)
Admission: RE | Admit: 2014-03-10 | Discharge: 2014-03-10 | Disposition: A | Discharge status: Home / Self Care | Payer: Managed Care, Other (non HMO) | Source: Ambulatory Visit | Attending: Obstetrics and Gynecology | Admitting: Obstetrics and Gynecology

## 2014-03-10 ENCOUNTER — Other Ambulatory Visit (HOSPITAL_COMMUNITY): Payer: Self-pay | Admitting: Obstetrics and Gynecology

## 2014-03-10 ENCOUNTER — Encounter (HOSPITAL_COMMUNITY): Payer: Self-pay

## 2014-03-10 DIAGNOSIS — Z01812 Encounter for preprocedural laboratory examination: Secondary | ICD-10-CM | POA: Insufficient documentation

## 2014-03-10 HISTORY — DX: Other seasonal allergic rhinitis: J30.2

## 2014-03-10 HISTORY — DX: Malignant (primary) neoplasm, unspecified: C80.1

## 2014-03-10 LAB — CBC
HCT: 34.7 % — ABNORMAL LOW (ref 36.0–46.0)
Hemoglobin: 11.3 g/dL — ABNORMAL LOW (ref 12.0–15.0)
MCH: 26.8 pg (ref 26.0–34.0)
MCHC: 32.6 g/dL (ref 30.0–36.0)
MCV: 82.2 fL (ref 78.0–100.0)
PLATELETS: 239 10*3/uL (ref 150–400)
RBC: 4.22 MIL/uL (ref 3.87–5.11)
RDW: 15.2 % (ref 11.5–15.5)
WBC: 9.5 10*3/uL (ref 4.0–10.5)

## 2014-03-10 NOTE — H&P (Signed)
[name] is a 48 y.o.   P 1-0-0-1 presents for abdominal myomectomy and left salpingo-oophorectomy  because of a persistent left ovarian cyst.  In 2003,  the patient underwent an abdominal myomectomy and left ovarian cystectomy because of symptoms related to the fibroids and the  persistence of the ovarian cyst.  Since that time the patient has had a recurrence of multiple left ovarian cysts  that have persisted and increased in size over the years.  Most recent pelvic ultrasound showed: uterus-10.7 x 7.77 x 7.05 cm, endometrium-0.80 cm,  #2 fibroids, right sub-serosal-  4.3 x 4.0 x 4.9 cm and anterior intramural- 3.1 x 3.2 x 3.4 cm; right ovary-2.79 x 2.15 x 1.49 cm and left ovary- 10.6 x 7.47 x  6.93 cm with a left ovarian cyst- 9.3 x 5.9 x 6.1 cm with normal color flow Doppler, no nodularity and low level internal echoes ( an increase over 10/2012 study when it was 5.2 cm); in left adnexa there was a cystic tubular structure, adjacent to left ovary, 3.2 x 1.6 4.0 cm suggestive of a left hydrosalpinx.  The patient describes her monthly periods as an 8 day flow in which she changes her pad 3 times a day with minimal cramping.  She admits to occasional left lower quadrant pain when rising from a sitting position along with dyspareunia but denies any incrased abdominal girth, vaginitis, bowel or bladder changes.  Given the increase in the size of her left ovarian cyst and the findngs of  an elevated CA-125 (40.2) she was sent for a GYN-Oncology consult with Dr. Janie Morning.  From that consultation it was recommended that she undergo a  left salpingo-oophorectomy.  In light of the patient's persistent ovarian cysts,  the recommendations of the GYN-Oncology specialist and the desire to maintain her fertility,  the patient has consented to proceed with an abdominal myomectomy with left salpingo-oophorectomy.   Past Medical History  OB History: G: 1  P: 1-0-0-1;  C-section 2005  GYN History:  menarche: 48 YO    LMP: 02/17/2014   Contracepton: none;  Denies history of STD;  Denies history of abnormal PAP smear;   Last PAP smear: 10/2013  Medical History: Endometriosis, Thyroid Cancer, Hirsutism, Infertility,   Surgical History: 2003 Diagnostic Laparoscopy, Abdominal Left Ovarian Cystectomy, Myomectomy, Hysteroscopy, Dilatation and Curettage-Endometriosis, benign fibroids and benign ovarian cystectomy and Excision of Left Thigh Lipoma and ; 2007 Thyroidectomy due to cancer; 2010 Left Breast Excisional Biopsy-Fibroadenoma Denies problems with anesthesia or history of blood transfusions  Family History: Breast Cancer, Hypertension, Diabetes Mellitus, Thyroid Disease, and Heart Disease  Social History: Married and employed with [employer];  Denies tobacco, alcohol or illicit drug use   Outpatient Encounter Prescriptions as of 03/10/2014  Medication Sig  . Ascorbic Acid (VITAMIN C) 1000 MG tablet Take 2,000 mg by mouth daily.   . calcium-vitamin D (OSCAL) 250-125 MG-UNIT per tablet Take 1 tablet by mouth daily.  . Garlic 4696 MG CAPS Take 2,000 mg by mouth daily.  Marland Kitchen levothyroxine (SYNTHROID, LEVOTHROID) 200 MCG tablet Take 200 mcg by mouth daily.  Fish Oil daily Multivitamin daily Vitamin D3 daily  No Known Allergies but avoids soy products  Denies sensitivity to peanuts, shellfish, latex or adhesives.   ROS: Admits to glasses, but  denies headache, vision changes, nasal congestion, dysphagia, tinnitus, dizziness, hoarseness, cough,  chest pain, shortness of breath, nausea, vomiting, diarrhea,constipation,  urinary frequency, urgency  dysuria, hematuria, vaginitis symptoms, pelvic pain, swelling of joints,easy  bruising,  myalgias, arthralgias, skin rashes, unexplained weight loss and except as is mentioned in the history of present illness, patient's review of systems is otherwise negative.  Physical Exam  Bp: 138/78 P: 86  R: 16 Temperature: 99.1 degrees F orally  Weight: 159  lbs.  Height: 5\' 2"   BMI: 29.1  Neck: supple without masses or thyromegaly Lungs: clear to auscultation Heart: regular rate and rhythm Abdomen: soft except for a firm mass from pelvis to 3 fingers above symphysis pubis, tenderness with voluntary guarding in left lower quadrant but no rebound and no organomegaly Pelvic:EGBUS- wnl; vagina-normal rugae; uterus-14-16 weeks size and irregular, cervix without lesions or motion tenderness; adnexae- tenderness in left adnexa accompanied by a firm, palpable fullness but no masses detected on the right. Extremities:  no clubbing, cyanosis or edema   Assesment: Persistent, Enlarging Left Ovarian Cyst           Uterine Fibroids   Disposition:  A discussion was held with patient regarding the indication for her procedure(s) along with the risks, which include but are not limited to: reaction to anesthesia, damage to adjacent organs, infection and excessive bleeding. The patient was given a Miralax Bowel Prep to be completed the day before her surgery.  She has verbalized understanding of the procedures and risks and has consented to proceed with an Abdominal Myomectomy, Left Salpingo-oophorectomy at [facility] on March 17, 2014 at 1 p.m.   CSN# [id number]   Deboraha Goar J. Florene Glen, PA-C  for Dr. Charlesetta Garibaldi

## 2014-03-11 ENCOUNTER — Other Ambulatory Visit: Payer: Self-pay | Admitting: Obstetrics and Gynecology

## 2014-03-17 ENCOUNTER — Encounter (HOSPITAL_COMMUNITY): Payer: Self-pay | Admitting: Anesthesiology

## 2014-03-17 ENCOUNTER — Encounter (HOSPITAL_COMMUNITY): Payer: Managed Care, Other (non HMO) | Admitting: Certified Registered Nurse Anesthetist

## 2014-03-17 ENCOUNTER — Encounter (HOSPITAL_COMMUNITY)
Admission: RE | Disposition: A | Discharge status: Home / Self Care | Payer: Self-pay | Source: Ambulatory Visit | Attending: Obstetrics and Gynecology

## 2014-03-17 ENCOUNTER — Inpatient Hospital Stay (HOSPITAL_COMMUNITY)
Admission: RE | Admit: 2014-03-17 | Discharge: 2014-03-19 | DRG: 743 | Disposition: A | Discharge status: Home / Self Care | Payer: Managed Care, Other (non HMO) | Source: Ambulatory Visit | Attending: Obstetrics and Gynecology | Admitting: Obstetrics and Gynecology

## 2014-03-17 ENCOUNTER — Ambulatory Visit (HOSPITAL_COMMUNITY): Payer: Managed Care, Other (non HMO) | Admitting: Certified Registered Nurse Anesthetist

## 2014-03-17 DIAGNOSIS — N8000 Endometriosis of the uterus, unspecified: Secondary | ICD-10-CM | POA: Diagnosis present

## 2014-03-17 DIAGNOSIS — N7013 Chronic salpingitis and oophoritis: Secondary | ICD-10-CM | POA: Diagnosis present

## 2014-03-17 DIAGNOSIS — R42 Dizziness and giddiness: Secondary | ICD-10-CM | POA: Diagnosis not present

## 2014-03-17 DIAGNOSIS — R112 Nausea with vomiting, unspecified: Secondary | ICD-10-CM | POA: Diagnosis not present

## 2014-03-17 DIAGNOSIS — E039 Hypothyroidism, unspecified: Secondary | ICD-10-CM | POA: Diagnosis present

## 2014-03-17 DIAGNOSIS — D259 Leiomyoma of uterus, unspecified: Secondary | ICD-10-CM | POA: Diagnosis present

## 2014-03-17 DIAGNOSIS — D649 Anemia, unspecified: Secondary | ICD-10-CM | POA: Diagnosis present

## 2014-03-17 DIAGNOSIS — Z9889 Other specified postprocedural states: Secondary | ICD-10-CM

## 2014-03-17 DIAGNOSIS — N736 Female pelvic peritoneal adhesions (postinfective): Secondary | ICD-10-CM | POA: Diagnosis present

## 2014-03-17 DIAGNOSIS — N83209 Unspecified ovarian cyst, unspecified side: Principal | ICD-10-CM

## 2014-03-17 DIAGNOSIS — N839 Noninflammatory disorder of ovary, fallopian tube and broad ligament, unspecified: Secondary | ICD-10-CM | POA: Diagnosis present

## 2014-03-17 DIAGNOSIS — D219 Benign neoplasm of connective and other soft tissue, unspecified: Secondary | ICD-10-CM

## 2014-03-17 HISTORY — PX: EXPLORATORY LAPAROTOMY WITH ABDOMINAL MASS EXCISION: SHX5169

## 2014-03-17 HISTORY — PX: CYSTOSCOPY: SHX5120

## 2014-03-17 LAB — CBC
HCT: 32.3 % — ABNORMAL LOW (ref 36.0–46.0)
HCT: 23.6 % — ABNORMAL LOW (ref 36.0–46.0)
HEMOGLOBIN: 7.6 g/dL — AB (ref 12.0–15.0)
Hemoglobin: 10.9 g/dL — ABNORMAL LOW (ref 12.0–15.0)
MCH: 28.3 pg (ref 26.0–34.0)
MCH: 25.9 pg — ABNORMAL LOW (ref 26.0–34.0)
MCHC: 33.7 g/dL (ref 30.0–36.0)
MCHC: 32.2 g/dL (ref 30.0–36.0)
MCV: 83.9 fL (ref 78.0–100.0)
MCV: 80.5 fL (ref 78.0–100.0)
PLATELETS: 252 10*3/uL (ref 150–400)
PLATELETS: 239 10*3/uL (ref 150–400)
RBC: 3.85 MIL/uL — AB (ref 3.87–5.11)
RBC: 2.93 MIL/uL — AB (ref 3.87–5.11)
RDW: 15.1 % (ref 11.5–15.5)
RDW: 15.1 % (ref 11.5–15.5)
WBC: 9.1 10*3/uL (ref 4.0–10.5)
WBC: 20.5 10*3/uL — ABNORMAL HIGH (ref 4.0–10.5)

## 2014-03-17 LAB — PREGNANCY, URINE: PREG TEST UR: NEGATIVE

## 2014-03-17 LAB — PREPARE RBC (CROSSMATCH)

## 2014-03-17 LAB — ABO/RH: ABO/RH(D): B POS

## 2014-03-17 SURGERY — CYSTOSCOPY
Anesthesia: General | Site: Bladder

## 2014-03-17 MED ORDER — LEVOTHYROXINE SODIUM 200 MCG PO TABS
200.0000 ug | ORAL_TABLET | Freq: Every day | ORAL | Status: DC
  Administered 2014-03-18 – 2014-03-19 (×2): 200 ug via ORAL
  Filled 2014-03-17 (×3): qty 1

## 2014-03-17 MED ORDER — GLYCOPYRROLATE 0.2 MG/ML IJ SOLN
INTRAMUSCULAR | Status: DC | PRN
  Administered 2014-03-17: .4 mg via INTRAVENOUS

## 2014-03-17 MED ORDER — HYDROMORPHONE 0.3 MG/ML IV SOLN
INTRAVENOUS | Status: DC
  Administered 2014-03-17: 19:00:00 via INTRAVENOUS
  Filled 2014-03-17: qty 25

## 2014-03-17 MED ORDER — ONDANSETRON HCL 4 MG/2ML IJ SOLN
INTRAMUSCULAR | Status: DC | PRN
  Administered 2014-03-17: 4 mg via INTRAVENOUS

## 2014-03-17 MED ORDER — ONDANSETRON HCL 4 MG/2ML IJ SOLN
INTRAMUSCULAR | Status: AC
  Filled 2014-03-17: qty 2

## 2014-03-17 MED ORDER — EPHEDRINE SULFATE 50 MG/ML IJ SOLN
INTRAMUSCULAR | Status: DC | PRN
  Administered 2014-03-17 (×2): 15 mg via INTRAVENOUS
  Administered 2014-03-17: 10 mg via INTRAVENOUS
  Administered 2014-03-17 (×2): 5 mg via INTRAVENOUS

## 2014-03-17 MED ORDER — FENTANYL CITRATE 0.05 MG/ML IJ SOLN
INTRAMUSCULAR | Status: DC | PRN
  Administered 2014-03-17 (×4): 50 ug via INTRAVENOUS
  Administered 2014-03-17: 100 ug via INTRAVENOUS
  Administered 2014-03-17: 50 ug via INTRAVENOUS

## 2014-03-17 MED ORDER — HYDROCODONE-ACETAMINOPHEN 5-325 MG PO TABS: 1.0000 | ORAL_TABLET | ORAL | Status: DC | PRN

## 2014-03-17 MED ORDER — ROCURONIUM BROMIDE 100 MG/10ML IV SOLN
INTRAVENOUS | Status: AC
  Filled 2014-03-17: qty 1

## 2014-03-17 MED ORDER — CEFAZOLIN SODIUM-DEXTROSE 2-3 GM-% IV SOLR
2.0000 g | INTRAVENOUS | Status: AC
  Administered 2014-03-17: 2 g via INTRAVENOUS

## 2014-03-17 MED ORDER — NEOSTIGMINE METHYLSULFATE 1 MG/ML IJ SOLN
INTRAMUSCULAR | Status: AC
  Filled 2014-03-17: qty 1

## 2014-03-17 MED ORDER — PHENYLEPHRINE HCL 10 MG/ML IJ SOLN
INTRAMUSCULAR | Status: DC | PRN
  Administered 2014-03-17 (×2): 80 ug via INTRAVENOUS

## 2014-03-17 MED ORDER — ALBUMIN HUMAN 5 % IV SOLN
25.0000 g | Freq: Once | INTRAVENOUS | Status: AC
  Administered 2014-03-17: 15:00:00 via INTRAVENOUS
  Filled 2014-03-17: qty 500

## 2014-03-17 MED ORDER — CEFAZOLIN SODIUM-DEXTROSE 2-3 GM-% IV SOLR
INTRAVENOUS | Status: AC
  Filled 2014-03-17: qty 50

## 2014-03-17 MED ORDER — HYDROMORPHONE HCL PF 1 MG/ML IJ SOLN: 0.2500 mg | INTRAMUSCULAR | Status: DC | PRN

## 2014-03-17 MED ORDER — LIDOCAINE HCL (CARDIAC) 20 MG/ML IV SOLN
INTRAVENOUS | Status: AC
  Filled 2014-03-17: qty 5

## 2014-03-17 MED ORDER — SODIUM CHLORIDE 0.9 % IJ SOLN
INTRAMUSCULAR | Status: AC
  Filled 2014-03-17: qty 100

## 2014-03-17 MED ORDER — KETOROLAC TROMETHAMINE 30 MG/ML IJ SOLN
INTRAMUSCULAR | Status: AC
  Filled 2014-03-17: qty 1

## 2014-03-17 MED ORDER — KETOROLAC TROMETHAMINE 30 MG/ML IJ SOLN: 15.0000 mg | Freq: Once | INTRAMUSCULAR | Status: DC | PRN

## 2014-03-17 MED ORDER — METHYLENE BLUE 1 % INJ SOLN
INTRAMUSCULAR | Status: AC
  Filled 2014-03-17: qty 10

## 2014-03-17 MED ORDER — PROPOFOL 10 MG/ML IV EMUL
INTRAVENOUS | Status: AC
  Filled 2014-03-17: qty 20

## 2014-03-17 MED ORDER — ROCURONIUM BROMIDE 100 MG/10ML IV SOLN
INTRAVENOUS | Status: DC | PRN
  Administered 2014-03-17 (×2): 10 mg via INTRAVENOUS
  Administered 2014-03-17: 50 mg via INTRAVENOUS

## 2014-03-17 MED ORDER — DIPHENHYDRAMINE HCL 12.5 MG/5ML PO ELIX: 12.5000 mg | ORAL_SOLUTION | Freq: Four times a day (QID) | ORAL | Status: DC | PRN

## 2014-03-17 MED ORDER — DIPHENHYDRAMINE HCL 50 MG/ML IJ SOLN: 12.5000 mg | Freq: Four times a day (QID) | INTRAMUSCULAR | Status: DC | PRN

## 2014-03-17 MED ORDER — MIDAZOLAM HCL 2 MG/2ML IJ SOLN
INTRAMUSCULAR | Status: AC
  Filled 2014-03-17: qty 2

## 2014-03-17 MED ORDER — PHENYLEPHRINE HCL 10 MG/ML IJ SOLN
20.0000 mg | INTRAVENOUS | Status: DC | PRN
  Administered 2014-03-17: 40 ug/min via INTRAVENOUS

## 2014-03-17 MED ORDER — LIDOCAINE HCL (CARDIAC) 20 MG/ML IV SOLN
INTRAVENOUS | Status: DC | PRN
  Administered 2014-03-17: 30 mg via INTRAVENOUS
  Administered 2014-03-17: 70 mg via INTRAVENOUS

## 2014-03-17 MED ORDER — KETOROLAC TROMETHAMINE 30 MG/ML IJ SOLN
30.0000 mg | Freq: Four times a day (QID) | INTRAMUSCULAR | Status: DC
  Administered 2014-03-17 – 2014-03-18 (×5): 30 mg via INTRAVENOUS
  Filled 2014-03-17 (×6): qty 1

## 2014-03-17 MED ORDER — METOCLOPRAMIDE HCL 5 MG/ML IJ SOLN: 10.0000 mg | Freq: Once | INTRAMUSCULAR | Status: DC | PRN

## 2014-03-17 MED ORDER — KETOROLAC TROMETHAMINE 30 MG/ML IJ SOLN: 30.0000 mg | Freq: Four times a day (QID) | INTRAMUSCULAR | Status: DC

## 2014-03-17 MED ORDER — ONDANSETRON HCL 4 MG/2ML IJ SOLN: 4.0000 mg | Freq: Four times a day (QID) | INTRAMUSCULAR | Status: DC | PRN

## 2014-03-17 MED ORDER — NEOSTIGMINE METHYLSULFATE 1 MG/ML IJ SOLN
INTRAMUSCULAR | Status: DC | PRN
  Administered 2014-03-17: 3 mg via INTRAVENOUS

## 2014-03-17 MED ORDER — MEPERIDINE HCL 25 MG/ML IJ SOLN: 6.2500 mg | INTRAMUSCULAR | Status: DC | PRN

## 2014-03-17 MED ORDER — DEXAMETHASONE SODIUM PHOSPHATE 10 MG/ML IJ SOLN
INTRAMUSCULAR | Status: DC | PRN
  Administered 2014-03-17: 10 mg via INTRAVENOUS

## 2014-03-17 MED ORDER — IBUPROFEN 600 MG PO TABS: 600.0000 mg | ORAL_TABLET | Freq: Four times a day (QID) | ORAL | Status: DC | PRN

## 2014-03-17 MED ORDER — VASOPRESSIN 20 UNIT/ML IJ SOLN
INTRAMUSCULAR | Status: AC
  Filled 2014-03-17: qty 1

## 2014-03-17 MED ORDER — PROPOFOL 10 MG/ML IV BOLUS
INTRAVENOUS | Status: DC | PRN
  Administered 2014-03-17: 180 mg via INTRAVENOUS

## 2014-03-17 MED ORDER — HYDROMORPHONE HCL PF 1 MG/ML IJ SOLN
INTRAMUSCULAR | Status: DC | PRN
  Administered 2014-03-17: 1 mg via INTRAVENOUS

## 2014-03-17 MED ORDER — NALOXONE HCL 0.4 MG/ML IJ SOLN: 0.4000 mg | INTRAMUSCULAR | Status: DC | PRN

## 2014-03-17 MED ORDER — METHYLENE BLUE 1 % INJ SOLN
INTRAMUSCULAR | Status: DC | PRN
  Administered 2014-03-17: 10 mg via INTRAVENOUS

## 2014-03-17 MED ORDER — MIDAZOLAM HCL 2 MG/2ML IJ SOLN
INTRAMUSCULAR | Status: DC | PRN
  Administered 2014-03-17: 2 mg via INTRAVENOUS

## 2014-03-17 MED ORDER — LACTATED RINGERS IV SOLN
INTRAVENOUS | Status: DC
  Administered 2014-03-17 (×3): via INTRAVENOUS

## 2014-03-17 MED ORDER — FENTANYL CITRATE 0.05 MG/ML IJ SOLN
INTRAMUSCULAR | Status: AC
  Filled 2014-03-17: qty 5

## 2014-03-17 MED ORDER — FENTANYL CITRATE 0.05 MG/ML IJ SOLN
INTRAMUSCULAR | Status: AC
  Filled 2014-03-17: qty 2

## 2014-03-17 MED ORDER — VASOPRESSIN 20 UNIT/ML IJ SOLN
INTRAVENOUS | Status: DC | PRN
  Administered 2014-03-17: 15:00:00 via INTRAMUSCULAR

## 2014-03-17 MED ORDER — SODIUM CHLORIDE 0.9 % IJ SOLN: 9.0000 mL | INTRAMUSCULAR | Status: DC | PRN

## 2014-03-17 MED ORDER — MENTHOL 3 MG MT LOZG: 1.0000 | LOZENGE | OROMUCOSAL | Status: DC | PRN

## 2014-03-17 MED ORDER — DEXTROSE IN LACTATED RINGERS 5 % IV SOLN
INTRAVENOUS | Status: DC
  Administered 2014-03-17 – 2014-03-18 (×4): via INTRAVENOUS

## 2014-03-17 MED ORDER — GLYCOPYRROLATE 0.2 MG/ML IJ SOLN
INTRAMUSCULAR | Status: AC
  Filled 2014-03-17: qty 3

## 2014-03-17 MED ORDER — DEXAMETHASONE SODIUM PHOSPHATE 10 MG/ML IJ SOLN
INTRAMUSCULAR | Status: AC
  Filled 2014-03-17: qty 1

## 2014-03-17 SURGICAL SUPPLY — 57 items
ADAPTER CATH SYR TO TUBING 38M (ADAPTER) IMPLANT
BARRIER ADHS 3X4 INTERCEED (GAUZE/BANDAGES/DRESSINGS) IMPLANT
CANISTER SUCT 3000ML (MISCELLANEOUS) ×4 IMPLANT
CLOTH BEACON ORANGE TIMEOUT ST (SAFETY) ×4 IMPLANT
CONT PATH 16OZ SNAP LID 3702 (MISCELLANEOUS) ×4 IMPLANT
CONTAINER PREFILL 10% NBF 15ML (MISCELLANEOUS) IMPLANT
DECANTER SPIKE VIAL GLASS SM (MISCELLANEOUS) ×4 IMPLANT
DERMABOND ADVANCED (GAUZE/BANDAGES/DRESSINGS) ×1
DERMABOND ADVANCED .7 DNX12 (GAUZE/BANDAGES/DRESSINGS) ×3 IMPLANT
DRAIN JACKSON PRT FLT 7MM (DRAIN) IMPLANT
DRSG OPSITE POSTOP 4X10 (GAUZE/BANDAGES/DRESSINGS) ×4 IMPLANT
ELECT NEEDLE TIP 2.8 STRL (NEEDLE) IMPLANT
EVACUATOR SILICONE 100CC (DRAIN) IMPLANT
GAUZE SPONGE 4X4 16PLY XRAY LF (GAUZE/BANDAGES/DRESSINGS) ×4 IMPLANT
GLOVE BIO SURGEON STRL SZ 6.5 (GLOVE) ×4 IMPLANT
GLOVE BIOGEL PI IND STRL 7.0 (GLOVE) ×3 IMPLANT
GLOVE BIOGEL PI INDICATOR 7.0 (GLOVE) ×1
GOWN STRL REUS W/TWL LRG LVL3 (GOWN DISPOSABLE) ×8 IMPLANT
HEMOSTAT SURGICEL 2X14 (HEMOSTASIS) IMPLANT
HEMOSTAT SURGICEL 2X3 (HEMOSTASIS) ×4 IMPLANT
HEMOSTAT SURGICEL 4X8 (HEMOSTASIS) ×4 IMPLANT
IV STOPCOCK 4 WAY 40  W/Y SET (IV SOLUTION)
IV STOPCOCK 4 WAY 40 W/Y SET (IV SOLUTION) IMPLANT
NEEDLE HYPO 25X1 1.5 SAFETY (NEEDLE) ×8 IMPLANT
NS IRRIG 1000ML POUR BTL (IV SOLUTION) ×4 IMPLANT
PACK ABDOMINAL GYN (CUSTOM PROCEDURE TRAY) ×4 IMPLANT
PAD OB MATERNITY 4.3X12.25 (PERSONAL CARE ITEMS) ×4 IMPLANT
SHEET LAVH (DRAPES) IMPLANT
SPONGE LAP 18X18 X RAY DECT (DISPOSABLE) ×16 IMPLANT
SPONGE SURGIFOAM ABS GEL 12-7 (HEMOSTASIS) IMPLANT
STAPLER VISISTAT 35W (STAPLE) ×4 IMPLANT
SUT CHROMIC 0 CT 1 (SUTURE) IMPLANT
SUT CHROMIC 3 0 SH 27 (SUTURE) IMPLANT
SUT MNCRL AB 3-0 PS2 27 (SUTURE) IMPLANT
SUT PDS AB 0 CT 36 (SUTURE) IMPLANT
SUT PLAIN 2 0 (SUTURE) ×1
SUT PLAIN 2 0 XLH (SUTURE) IMPLANT
SUT PLAIN ABS 2-0 CT1 27XMFL (SUTURE) ×3 IMPLANT
SUT VIC AB 0 CT1 18XCR BRD8 (SUTURE) ×6 IMPLANT
SUT VIC AB 0 CT1 36 (SUTURE) ×8 IMPLANT
SUT VIC AB 0 CT1 8-18 (SUTURE) ×2
SUT VIC AB 2-0 SH 27 (SUTURE)
SUT VIC AB 2-0 SH 27XBRD (SUTURE) IMPLANT
SUT VIC AB 3-0 CT1 27 (SUTURE) ×5
SUT VIC AB 3-0 CT1 TAPERPNT 27 (SUTURE) ×15 IMPLANT
SUT VIC AB 3-0 SH 27 (SUTURE) ×4
SUT VIC AB 3-0 SH 27X BRD (SUTURE) ×12 IMPLANT
SUT VICRYL 0 TIES 12 18 (SUTURE) ×4 IMPLANT
SUT VICRYL 0 UR6 27IN ABS (SUTURE) IMPLANT
SUT VICRYL 2 0 18  UND BR (SUTURE)
SUT VICRYL 2 0 18 UND BR (SUTURE) IMPLANT
SYR 50ML LL SCALE MARK (SYRINGE) IMPLANT
SYR BULB IRRIGATION 50ML (SYRINGE) ×4 IMPLANT
SYR CONTROL 10ML LL (SYRINGE) ×8 IMPLANT
TOWEL OR 17X24 6PK STRL BLUE (TOWEL DISPOSABLE) ×8 IMPLANT
TRAY FOLEY CATH 14FR (SET/KITS/TRAYS/PACK) ×4 IMPLANT
WATER STERILE IRR 1000ML POUR (IV SOLUTION) ×4 IMPLANT

## 2014-03-17 NOTE — Transfer of Care (Signed)
Immediate Anesthesia Transfer of Care Note  Patient: [name]  Procedure(s) Performed: Procedure(s): CYSTOSCOPY (N/A) ABDOMINAL MYOMECTOMY WITH LEFT SALPINGO OOPHORECTOMY LYSIS OF ADHESIONS  Patient Location: PACU  Anesthesia Type:General  Level of Consciousness: awake, alert  and oriented  Airway & Oxygen Therapy: Patient Spontanous Breathing and Patient connected to nasal cannula oxygen  Post-op Assessment: Report given to PACU RN and Post -op Vital signs reviewed and stable  Post vital signs: stable  Complications: No apparent anesthesia complications

## 2014-03-17 NOTE — Progress Notes (Signed)
Patient ID: [name], female   DOB: 1966/10/08, 48 y.o.   MRN: [id number] Date of Initial H&P: 4/7  History reviewed, patient examined, no change in status, stable for surgery.

## 2014-03-17 NOTE — Anesthesia Preprocedure Evaluation (Addendum)
Anesthesia Evaluation  Patient identified by MRN, date of birth, ID band Patient awake    Reviewed: Allergy & Precautions, H&P , NPO status , Patient's Chart, lab work & pertinent test results, reviewed documented beta blocker date and time   History of Anesthesia Complications Negative for: history of anesthetic complications  Airway Mallampati: I TM Distance: >3 FB Neck ROM: full    Dental  (+) Teeth Intact   Pulmonary neg pulmonary ROS,  breath sounds clear to auscultation  Pulmonary exam normal       Cardiovascular negative cardio ROS  Rhythm:regular Rate:Normal     Neuro/Psych negative neurological ROS  negative psych ROS   GI/Hepatic negative GI ROS, Neg liver ROS,   Endo/Other  Hypothyroidism   Renal/GU negative Renal ROS  Female GU complaint     Musculoskeletal   Abdominal Normal abdominal exam  (+)   Peds  Hematology  (+) anemia ,   Anesthesia Other Findings   Reproductive/Obstetrics negative OB ROS                          Anesthesia Physical Anesthesia Plan  ASA: II  Anesthesia Plan: General ETT   Post-op Pain Management:    Induction:   Airway Management Planned:   Additional Equipment:   Intra-op Plan:   Post-operative Plan:   Informed Consent: I have reviewed the patients History and Physical, chart, labs and discussed the procedure including the risks, benefits and alternatives for the proposed anesthesia with the patient or authorized representative who has indicated his/her understanding and acceptance.   Dental Advisory Given  Plan Discussed with: CRNA and Surgeon  Anesthesia Plan Comments:         Anesthesia Quick Evaluation

## 2014-03-17 NOTE — Op Note (Signed)
Preop diagnosis: Symptomatic fibroids and adnexal mass Postdiagnosis:  Same Procedure: Abdominal myomectomy, exploratory laparotomy, LOA, LSO and cystoscopy Surgeon: Dr. Charlesetta Garibaldi Assistant: Dr. Mancel Bale Anesthesia: General endotracheal tube Estimated blood loss is 1200 cc Urine output: 1600 cc clear urine IV fluids: 3200 cc Indications: None Procedure in detail: She was taken to the operating room placed in dorsal supine position and prepped and draped in the normal sterile fashion. A Pfannenstiel skin incision was made with a knife and carried down to the fascia using Bovie cautery. The fascia was incised in midline extended bilaterally. Cokers x2 placed in the superior aspect of the fascia was dissected off the rectus muscles both sharply and bluntly.  Peritoneum was identified And entered sharply with Metzenbaum scissors. The incision was extended superiorly and inferiorly with good visualization of bowel bladder. Was unable to exteriorize the uterus was enhanced with 2 towel clamps were placed on the fundus and I was able to lift the uterus out of the abdominal cavity using the towel clamps.  The uterus felt "mushy" to be consistent with adenomyosis. . A large 10 cm hydrosalpinx and ovarian mass was seen on the left side.  The mass was adherent to the posterior side of the uterus and and bowel.  The mass was dissected off of the bowel with blunt and sharp dissection.  The infindibulo pelvic ligament was clamped and cut and tied with a free tie.   Petrussin was placed  In the fundus just above the fibroid.  The fibroid was then bluntly and sharply dissected out of the cavity was about 5 cm in size. The endometrial canal was in not entered. The incision was closed in layers using 0 and 3-0 Vicryl with figure-of-eight interrupted suture. The serosa was closed using a 3-0 Vicryl in a baseball stitch fashion. Another incision was made in the lower uterine segment after petrussin mixture was placed.  Three  other fibroids were removed.  The uterus was repaired in the similar fashion as above. Areas were made hemostatic with 2-0 Vicryl figure-of-eight stitch. Irrigation was done.  There was overall oozing from several areas where lysis of adhesions were done.  This was made hemostatic with warm compresses and sugicel along the suture lines and in the left lower quadrant near the infindibulo-pelvic ligament.   The belfour retractor was removed. The peritoneum was closed to 3-0  Vicryl.  .  Muscles were irrigated and hemostatic with Bovie cautery and any bleeding areas. The fascia was closed using 0 Vicryl in a running fashion. Subcutaneous  tissue was irrigated and made hemostatic with Bovie cautery.using  2-0 plain interrupted sutures were used to reapproximate the subcutaneous tissue. The skin was closed with 3-0 Monocryl subarticular fashion. Because of the dense adhesions, I was not able to see the ureter on the left side before interrupting the left infindibulo-pelvic ligament.  The pt was given methyline blue and foley catheter was removed.  Betadine was placed around the uretha.  I performed cystoscopy.  Both ureters were seen to efflux methyline blue and the bladder was intact without any deformities.

## 2014-03-18 ENCOUNTER — Encounter (HOSPITAL_COMMUNITY): Payer: Self-pay | Admitting: Obstetrics and Gynecology

## 2014-03-18 LAB — CBC
HEMATOCRIT: 21.3 % — AB (ref 36.0–46.0)
Hemoglobin: 7.4 g/dL — ABNORMAL LOW (ref 12.0–15.0)
MCH: 29.4 pg (ref 26.0–34.0)
MCHC: 34.7 g/dL (ref 30.0–36.0)
MCV: 84.5 fL (ref 78.0–100.0)
Platelets: 184 10*3/uL (ref 150–400)
RBC: 2.52 MIL/uL — ABNORMAL LOW (ref 3.87–5.11)
RDW: 14.6 % (ref 11.5–15.5)
WBC: 14.8 10*3/uL — AB (ref 4.0–10.5)

## 2014-03-18 LAB — BASIC METABOLIC PANEL
BUN: 7 mg/dL (ref 6–23)
CALCIUM: 7.7 mg/dL — AB (ref 8.4–10.5)
CHLORIDE: 104 meq/L (ref 96–112)
CO2: 23 mEq/L (ref 19–32)
Creatinine, Ser: 0.66 mg/dL (ref 0.50–1.10)
GFR calc non Af Amer: >90 mL/min (ref >90)
Glucose, Bld: 148 mg/dL — ABNORMAL HIGH (ref 70–99)
Potassium: 3.9 mEq/L (ref 3.7–5.3)
Sodium: 138 mEq/L (ref 137–147)

## 2014-03-18 MED ORDER — METOCLOPRAMIDE HCL 10 MG PO TABS
10.0000 mg | ORAL_TABLET | Freq: Four times a day (QID) | ORAL | Status: DC
  Administered 2014-03-18 – 2014-03-19 (×5): 10 mg via ORAL
  Filled 2014-03-18 (×5): qty 1

## 2014-03-18 MED ORDER — ONDANSETRON HCL 4 MG PO TABS
8.0000 mg | ORAL_TABLET | Freq: Three times a day (TID) | ORAL | Status: DC | PRN
  Administered 2014-03-18: 8 mg via ORAL
  Filled 2014-03-18: qty 2

## 2014-03-18 MED ORDER — CEFAZOLIN SODIUM-DEXTROSE 1-4 GM-% IV SOLR
1.0000 g | Freq: Three times a day (TID) | INTRAVENOUS | Status: AC
  Administered 2014-03-18 (×2): 1 g via INTRAVENOUS
  Filled 2014-03-18 (×3): qty 50

## 2014-03-18 MED ORDER — IBUPROFEN 600 MG PO TABS: 600.0000 mg | ORAL_TABLET | Freq: Four times a day (QID) | ORAL | Status: DC

## 2014-03-18 NOTE — Progress Notes (Signed)
Patient ID: Danea Manter, female   DOB: 1966-08-23, 48 y.o.   MRN: [id number] 1 Day Post-Op Procedure(s): CYSTOSCOPY ABDOMINAL MYOMECTOMY WITH LEFT SALPINGO OOPHORECTOMY LYSIS OF ADHESIONS  Subjective: Patient reports incisional pain, tolerating PO and + flatus.  Pt still feels a little "whoozy with rising"  No Cp or SOB. Pt tolerated breakfast. Now she is eating lunch  Objective: BP 103/56  Pulse 88  Temp(Src) 99.1 F (37.3 C) (Oral)  Resp 16  Ht 5\' 2"  (1.575 m)  Wt 71.668 kg (158 lb)  BMI 28.89 kg/m2  SpO2 98%  LMP 02/17/2014  General: alert and cooperative Resp: clear to auscultation bilaterally Cardio: regular rate and rhythm GI: soft, non-tender; bowel sounds normal; no masses,  no organomegaly Extremities: extremities normal, atraumatic, no cyanosis or edema Vaginal Bleeding: minimal  Assessment: s/p Procedure(s): CYSTOSCOPY ABDOMINAL MYOMECTOMY WITH LEFT SALPINGO OOPHORECTOMY LYSIS OF ADHESIONS: tolerating diet, anemia and will transfuse a second unit of blood.  check cbc in am.  dc foley and IVF tomorrow  Plan: Advance diet Advance to PO medication Continue foley due to blood transfusion; will continue until blood transfusion complete  LOS: 1 day    Olyver Hawes A Deshone Lyssy 03/18/2014, 1:59 PM

## 2014-03-18 NOTE — Progress Notes (Signed)
Ur chart review completed.  

## 2014-03-18 NOTE — Progress Notes (Signed)
[name] is a24 y.o.  [id number]  Post Op Date # 1: S/P  Abdominal Myomectomy, Exploratory Laparotomy, LOA/LSO  Subjective: Patient is Doing well postoperatively. Reports however, nausea and dizziness when she tries to sit up or stand.  Patient has soreness, she states, but no true pain.  Tolerating liquids but last night when she tried to eat some soft foods she vomited twice. Unable to ambulate due to dizziness and nausea. , Will maintain Foley until able to ambulate.  Objective: Vital signs in last 24 hours: Temp:  [97.8 F (36.6 C)-98.6 F (37 C)] 98.6 F (37 C) (04/15 0600) Pulse Rate:  [66-92] 78 (04/15 0600) Resp:  [10-23] 16 (04/15 0600) BP: (87-127)/(46-71) 97/53 mmHg (04/15 0600) SpO2:  [100 %] 100 % (04/15 0600) Weight:  [158 lb (71.668 kg)] 158 lb (71.668 kg) (04/14 1930)  Intake/Output from previous day: 04/14 0701 - 04/15 0700 In: 8571.5 [P.O.:1200; I.V.:6786.5] Out: 4225 [Urine:2975] Intake/Output this shift: Total I/O In: 2486.5 [P.O.:1200; I.V.:1286.5] Out: 1250 [Urine:1200; Emesis/NG output:50]  Recent Labs Lab 03/17/14 1140 03/17/14 1535 03/18/14 0528  WBC 9.1 20.5* 14.8*  HGB 10.9* 7.6* 7.4*  HCT 32.3* 23.6* 21.3*  PLT 252 239 184     Recent Labs Lab 03/18/14 0528  NA 138  K 3.9  CL 104  CO2 23  BUN 7  CREATININE 0.66  CALCIUM 7.7*  GLUCOSE 148*    EXAM: General: alert, cooperative, mild distress and reports soreness Resp: clear to auscultation bilaterally Cardio: regular rate and rhythm, S1, S2 normal, no murmur, click, rub or gallop GI: Bowel sounds present, dressing clean/dry and intact. Extremities: Homans sign is negative, no sign of DVT and no calft tenderness.    Assessment: s/p Procedure(s): CYSTOSCOPY ABDOMINAL MYOMECTOMY WITH LEFT SALPINGO OOPHORECTOMY LYSIS OF ADHESIONS: anemia and nausea  Plan: Per Dr. Charlesetta Garibaldi: Reglan 10 mg every 6 hours Retain Foley until ambulatory Orthostatic vital signs Zofran 8  mg po stat then every 8 hours as needed  LOS: 1 day    Earnstine Regal, PA-C 03/18/2014 6:43 AM

## 2014-03-18 NOTE — Anesthesia Postprocedure Evaluation (Signed)
Anesthesia Post Note  Patient: [name]  Procedure(s) Performed: Procedure(s) (LRB): CYSTOSCOPY (N/A) ABDOMINAL MYOMECTOMY WITH LEFT SALPINGO OOPHORECTOMY LYSIS OF ADHESIONS  Anesthesia type: General  Patient location: [facility]  Post pain: Pain level controlled  Post assessment: Post-op Vital signs reviewed  Last Vitals:  Filed Vitals:   03/18/14 1615  BP: 97/48  Pulse: 90  Temp: 37.2 C  Resp: 20    Post vital signs: Reviewed  Level of consciousness: sedated  Complications: No apparent anesthesia complications

## 2014-03-19 LAB — CBC
HCT: 25.1 % — ABNORMAL LOW (ref 36.0–46.0)
Hemoglobin: 8.7 g/dL — ABNORMAL LOW (ref 12.0–15.0)
MCH: 29.2 pg (ref 26.0–34.0)
MCHC: 34.7 g/dL (ref 30.0–36.0)
MCV: 84.2 fL (ref 78.0–100.0)
Platelets: 180 10*3/uL (ref 150–400)
RBC: 2.98 MIL/uL — ABNORMAL LOW (ref 3.87–5.11)
RDW: 14.9 % (ref 11.5–15.5)
WBC: 18.4 10*3/uL — ABNORMAL HIGH (ref 4.0–10.5)

## 2014-03-19 LAB — TYPE AND SCREEN
ABO/RH(D): B POS
ANTIBODY SCREEN: NEGATIVE
UNIT DIVISION: 0
Unit division: 0

## 2014-03-19 MED ORDER — ONDANSETRON HCL 8 MG PO TABS: 8.0000 mg | ORAL_TABLET | Freq: Three times a day (TID) | ORAL | Status: AC | PRN

## 2014-03-19 MED ORDER — HYDROCODONE-ACETAMINOPHEN 5-325 MG PO TABS: 1.0000 | ORAL_TABLET | ORAL | Status: AC | PRN

## 2014-03-19 MED ORDER — METOCLOPRAMIDE HCL 10 MG PO TABS: 10.0000 mg | ORAL_TABLET | Freq: Three times a day (TID) | ORAL | Status: AC

## 2014-03-19 MED ORDER — IBUPROFEN 600 MG PO TABS: 600.0000 mg | ORAL_TABLET | Freq: Four times a day (QID) | ORAL | Status: AC

## 2014-03-19 NOTE — Progress Notes (Signed)
[name] is a45 y.o.  [id number]  Post Op Date # 2: Abdominal Myomectomy, LOA/LSO/Cystoscopy  Subjective: Patient is feeling much better post operatively.  Tolerating regular diet. Patient has soreness at incision site but no "real" pain, ambulating in the halls but still has Foley. Objective: Vital signs in last 24 hours: Temp:  [98.6 F (37 C)-99.5 F (37.5 C)] 98.7 F (37.1 C) (04/16 0559) Pulse Rate:  [84-105] 94 (04/16 0606) Resp:  [12-20] 18 (04/16 0559) BP: (89-129)/(45-74) 126/70 mmHg (04/16 0606) SpO2:  [98 %-100 %] 100 % (04/16 0559)  Intake/Output from previous day: 04/15 0701 - 04/16 0700 In: 2609.6 [P.O.:1000; I.V.:1297.1] Out: 6100 [Urine:6100] Intake/Output this shift: Total I/O In: 2297.1 [P.O.:1000; I.V.:1297.1] Out: 3500 [Urine:3500]  Recent Labs Lab 03/17/14 1140 03/17/14 1535 03/18/14 0528  WBC 9.1 20.5* 14.8*  HGB 10.9* 7.6* 7.4*  HCT 32.3* 23.6* 21.3*  PLT 252 239 184     Recent Labs Lab 03/18/14 0528  NA 138  K 3.9  CL 104  CO2 23  BUN 7  CREATININE 0.66  CALCIUM 7.7*  GLUCOSE 148*    EXAM: General: alert, cooperative and no distress Resp: clear to auscultation bilaterally Cardio: regular rate and rhythm, S1, S2 normal, no murmur, click, rub or gallop GI: Bowel sounds present and dressing with single dry stain left of dressing but otherwise clean, dry and intact. Extremities: SCD hose in place and functioning, no calf tenderness.   Assessment: s/p Procedure(s): CYSTOSCOPY ABDOMINAL MYOMECTOMY WITH LEFT SALPINGO OOPHORECTOMY LYSIS OF ADHESIONS: stable, progressing well, anemia and S/P Transfusion 2 Units PRBC  Plan: D/C Foley and IV Routine Care  LOS: 2 days    Earnstine Regal, PA-C 03/19/2014 6:59 AM

## 2014-03-19 NOTE — Discharge Summary (Signed)
Physician Discharge Summary  Patient ID: [name] MRN: [id number] DOB/AGE: 12-17-1965 48 y.o.  Admit date: 03/17/2014 Discharge date: 03/19/2014  Admission Diagnoses: adnexal mass and fibroids  Discharge Diagnoses: same Active Problems:   S/P myomectomy   Discharged Condition: good  Hospital Course: Pt presented for Abdominal myomectomy, LSO, extensive LOA and cystoscopy.  She had a large blood loss of 1200 cc.  She was given crystalloid, IVF and 2 units of blood.  She is tolerating PO and had a BM.  She will be discharged home to follow up in 6 weeks  Consults: None  Significant Diagnostic Studies: HGB 8.7 after blood  Treatments: antibiotics: Ancef and surgery: see above  Discharge Exam: Blood pressure 126/70, pulse 94, temperature 98.7 F (37.1 C), temperature source Oral, resp. rate 18, height 5\' 2"  (1.575 m), weight 71.668 kg (158 lb), last menstrual period 02/17/2014, SpO2 100.00%. General appearance: alert and cooperative Head: Normocephalic, without obvious abnormality, atraumatic Resp: clear to auscultation bilaterally Cardio: regular rate and rhythm GI: soft, non-tender; bowel sounds normal; no masses,  no organomegaly and dressing CDI Pelvic: no significant VB Extremities: Homans sign is negative, no sign of DVT  Disposition:   Discharge Orders   Future Orders Complete By Expires   Call MD for:  persistant dizziness or light-headedness  As directed    Call MD for:  severe uncontrolled pain  As directed    Call MD for:  temperature >100.4  As directed    Diet general  As directed    Discharge instructions  As directed    Increase activity slowly  As directed    May shower / Bathe  As directed    Sexual Activity Restrictions  As directed        Medication List         CALCIUM-MAGNESIUM-ZINC-D3 PO  Take 30 mLs by mouth daily.     calcium-vitamin D 250-125 MG-UNIT per tablet  Commonly known as:  OSCAL  Take 1 tablet by mouth daily.     Garlic 3532 MG Caps  Take 2,000 mg by mouth daily.     HYDROcodone-acetaminophen 5-325 MG per tablet  Commonly known as:  NORCO/VICODIN  Take 1-2 tablets by mouth every 4 (four) hours as needed for moderate pain.     ibuprofen 600 MG tablet  Commonly known as:  ADVIL,MOTRIN  Take 1 tablet (600 mg total) by mouth every 6 (six) hours.  Start taking on:  03/23/2014     levothyroxine 200 MCG tablet  Commonly known as:  SYNTHROID, LEVOTHROID  Take 200 mcg by mouth daily.     metoCLOPramide 10 MG tablet  Commonly known as:  REGLAN  Take 1 tablet (10 mg total) by mouth 4 (four) times daily -  before meals and at bedtime.     ondansetron 8 MG tablet  Commonly known as:  ZOFRAN  Take 1 tablet (8 mg total) by mouth every 8 (eight) hours as needed for nausea or vomiting.     vitamin C 1000 MG tablet  Take 2,000 mg by mouth daily.           Follow-up Information   Follow up with [facility], MD In 6 weeks.   Specialty:  Obstetrics and Gynecology   Contact information:   [address] [place] [address] [phone]       Signed: Betsy Coder 03/19/2014, 11:23 AM

## 2014-03-19 NOTE — Discharge Instructions (Signed)
Myomectomy, Care After  Refer to this sheet in the next few weeks. These instructions provide you with information on caring for yourself after your procedure. Your health care provider may also give you more specific instructions. Your treatment has been planned according to current medical practices, but problems sometimes occur. Call your health care provider if you have any problems or questions after your procedure.  WHAT TO EXPECT AFTER THE PROCEDURE  After your procedure, it is typical to have the following:  · Pain in your abdomen, especially at any incision sites. You will be given pain medicine to control the pain.  · Tiredness. This is a normal part of the recovery process. Your energy level will return to normal over the next several weeks.  · Constipation.  · Vaginal bleeding. This is normal and should stop after 1 2 weeks.  HOME CARE INSTRUCTIONS   · Only take over-the-counter or prescription medicines as directed by your health care provider. Avoid aspirin because it can cause bleeding.  · Do not douche, use tampons, or have sexual intercourse until given permission by your health care provider.  · Remove or change any bandages (dressings) as directed by your health care provider.  · Take showers instead of baths as directed by your health care provider.  · You will probably be able to go back to your normal routine after a few days. Do not do anything that requires extra effort until your health care provider says it is okay. Do not lift anything heavier than 15 pounds (6.8 kg) until your health care provider approves.  · Walk daily but take frequent rest breaks if you tire easily.  · Continue to practice deep breathing and coughing. If it hurts to cough, try holding a pillow against your belly as you cough.  · If you become constipated, you may:  · Use a mild laxative if your health care provider approves.  · Add more fruit and bran to your diet.  · Drink enough fluids to keep your urine clear or  pale yellow.  · Take your temperature twice a day and write it down.  · Do not drink alcohol.  · Do not drive until your health care provider approves.  · Have someone help you at home for 1 week or until you can do your own household activities.  · Follow up with your health care provider as directed.  SEEK MEDICAL CARE IF:  · You have a fever.  · You have increasing abdominal pain that is not relieved with medicine.  · You have nausea, vomiting, or diarrhea.  · You have pain when you urinate, or you have blood in your urine.  · You have a rash on your body.  · You have pain or redness where your IV access tube was inserted.  · You have redness, swelling, or any kind of drainage from an incision.  SEEK IMMEDIATE MEDICAL CARE IF:   · You have weakness or lightheadedness.  · You have pain, swelling, or redness in your legs.  · You have chest pain.  · You faint.  · You have shortness of breath.  · You have heavy vaginal bleeding.  · Your incision is opening up.  Document Released: 04/12/2011 Document Revised: 09/10/2013 Document Reviewed: 07/02/2013  [employer]® Patient Information ©2014 [employer].

## 2014-04-22 NOTE — H&P (Signed)
[name] is a 48 y.o.   P 1-0-0-1 presents for abdominal myomectomy and left salpingo-oophorectomy  because of a persistent left ovarian cyst.  In 2003,  the patient underwent an abdominal myomectomy and left ovarian cystectomy because of symptoms related to the fibroids and the  persistence of the ovarian cyst.  Since that time the patient has had a recurrence of multiple left ovarian cysts  that have persisted and increased in size over the years.  Most recent pelvic ultrasound showed: uterus-10.7 x 7.77 x 7.05 cm, endometrium-0.80 cm,  #2 fibroids, right sub-serosal-  4.3 x 4.0 x 4.9 cm and anterior intramural- 3.1 x 3.2 x 3.4 cm; right ovary-2.79 x 2.15 x 1.49 cm and left ovary- 10.6 x 7.47 x  6.93 cm with a left ovarian cyst- 9.3 x 5.9 x 6.1 cm with normal color flow Doppler, no nodularity and low level internal echoes ( an increase over 10/2012 study when it was 5.2 cm); in left adnexa there was a cystic tubular structure, adjacent to left ovary, 3.2 x 1.6 4.0 cm suggestive of a left hydrosalpinx.  The patient describes her monthly periods as an 8 day flow in which she changes her pad 3 times a day with minimal cramping.  She admits to occasional left lower quadrant pain when rising from a sitting position along with dyspareunia but denies any incrased abdominal girth, vaginitis, bowel or bladder changes.  Given the increase in the size of her left ovarian cyst and the findngs of  an elevated CA-125 (40.2) she was sent for a GYN-Oncology consult with Dr. Wendy Brewster.  From that consultation it was recommended that she undergo a  left salpingo-oophorectomy.  In light of the patient's persistent ovarian cysts,  the recommendations of the GYN-Oncology specialist and the desire to maintain her fertility,  the patient has consented to proceed with an abdominal myomectomy with left salpingo-oophorectomy.   Past Medical History  OB History: G: 1  P: 1-0-0-1;  C-section 2005  GYN History:  menarche: 48 YO    LMP: 02/17/2014   Contracepton: none;  Denies history of STD;  Denies history of abnormal PAP smear;   Last PAP smear: 10/2013  Medical History: Endometriosis, Thyroid Cancer, Hirsutism, Infertility,   Surgical History: 2003 Diagnostic Laparoscopy, Abdominal Left Ovarian Cystectomy, Myomectomy, Hysteroscopy, Dilatation and Curettage-Endometriosis, benign fibroids and benign ovarian cystectomy and Excision of Left Thigh Lipoma and ; 2007 Thyroidectomy due to cancer; 2010 Left Breast Excisional Biopsy-Fibroadenoma Denies problems with anesthesia or history of blood transfusions  Family History: Breast Cancer, Hypertension, Diabetes Mellitus, Thyroid Disease, and Heart Disease  Social History: Married and employed with [employer];  Denies tobacco, alcohol or illicit drug use   Outpatient Encounter Prescriptions as of 03/10/2014  Medication Sig  . Ascorbic Acid (VITAMIN C) 1000 MG tablet Take 2,000 mg by mouth daily.   . calcium-vitamin D (OSCAL) 250-125 MG-UNIT per tablet Take 1 tablet by mouth daily.  . Garlic 2000 MG CAPS Take 2,000 mg by mouth daily.  . levothyroxine (SYNTHROID, LEVOTHROID) 200 MCG tablet Take 200 mcg by mouth daily.  Fish Oil daily Multivitamin daily Vitamin D3 daily  No Known Allergies but avoids soy products  Denies sensitivity to peanuts, shellfish, latex or adhesives.   ROS: Admits to glasses, but  denies headache, vision changes, nasal congestion, dysphagia, tinnitus, dizziness, hoarseness, cough,  chest pain, shortness of breath, nausea, vomiting, diarrhea,constipation,  urinary frequency, urgency  dysuria, hematuria, vaginitis symptoms, pelvic pain, swelling of joints,easy   otherwise negative.  Physical Exam  Bp: 138/78 P: 86 R: 16 Temperature: 99.1 degrees F orally Weight: 159 lbs. Height: 5\' 2"  BMI: 29.1  Neck: supple  without masses or thyromegaly  Lungs: clear to auscultation  Heart: regular rate and rhythm  Abdomen: soft except for a firm mass from pelvis to 3 fingers above symphysis pubis, tenderness with voluntary guarding in left lower quadrant but no rebound and no organomegaly  Pelvic:EGBUS- wnl; vagina-normal rugae; uterus-14-16 weeks size and irregular, cervix without lesions or motion tenderness; adnexae- tenderness in left adnexa accompanied by a firm, palpable fullness but no masses detected on the right.  Extremities: no clubbing, cyanosis or edema  Assesment: Persistent, Enlarging Left Ovarian Cyst  Uterine Fibroids  Disposition: A discussion was held with patient regarding the indication for her procedure(s) along with the risks, which include but are not limited to: reaction to anesthesia, damage to adjacent organs, infection and excessive bleeding. The patient was given a Miralax Bowel Prep to be completed the day before her surgery. She has verbalized understanding of the procedures and risks and has consented to proceed with an Abdominal Myomectomy, Left Salpingo-oophorectomy at [facility] on March 17, 2014 at 1 p.m.  CSN# [id number]  Alleigh Mollica J. Florene Glen, PA-C for Dr. Charlesetta Garibaldi

## 2014-04-30 ENCOUNTER — Other Ambulatory Visit: Payer: Self-pay

## 2014-04-30 DIAGNOSIS — Z1231 Encounter for screening mammogram for malignant neoplasm of breast: Secondary | ICD-10-CM

## 2014-05-11 ENCOUNTER — Ambulatory Visit
Admission: RE | Admit: 2014-05-11 | Discharge: 2014-05-11 | Disposition: A | Discharge status: Home / Self Care | Payer: Private Health Insurance - Indemnity | Source: Ambulatory Visit

## 2014-05-11 ENCOUNTER — Encounter (INDEPENDENT_AMBULATORY_CARE_PROVIDER_SITE_OTHER): Payer: Self-pay

## 2014-05-11 DIAGNOSIS — Z1231 Encounter for screening mammogram for malignant neoplasm of breast: Secondary | ICD-10-CM

## 2014-10-05 ENCOUNTER — Encounter (HOSPITAL_COMMUNITY): Payer: Self-pay | Admitting: Obstetrics and Gynecology

## 2015-03-26 ENCOUNTER — Other Ambulatory Visit (HOSPITAL_COMMUNITY): Payer: Self-pay | Admitting: Endocrinology

## 2015-03-26 DIAGNOSIS — C73 Malignant neoplasm of thyroid gland: Secondary | ICD-10-CM

## 2015-04-05 ENCOUNTER — Other Ambulatory Visit: Payer: Self-pay

## 2015-04-05 DIAGNOSIS — Z1231 Encounter for screening mammogram for malignant neoplasm of breast: Secondary | ICD-10-CM

## 2015-04-12 ENCOUNTER — Encounter (HOSPITAL_COMMUNITY)
Admission: RE | Admit: 2015-04-12 | Discharge: 2015-04-12 | Disposition: A | Discharge status: Home / Self Care | Payer: BLUE CROSS/BLUE SHIELD | Source: Ambulatory Visit | Attending: Endocrinology | Admitting: Endocrinology

## 2015-04-12 DIAGNOSIS — C73 Malignant neoplasm of thyroid gland: Secondary | ICD-10-CM | POA: Diagnosis not present

## 2015-04-12 MED ORDER — THYROTROPIN ALFA 1.1 MG IM SOLR
0.9000 mg | INTRAMUSCULAR | Status: AC
  Administered 2015-04-12: 0.9 mg via INTRAMUSCULAR

## 2015-04-13 ENCOUNTER — Encounter (HOSPITAL_COMMUNITY)
Admission: RE | Admit: 2015-04-13 | Discharge: 2015-04-13 | Disposition: A | Discharge status: Home / Self Care | Payer: BLUE CROSS/BLUE SHIELD | Source: Ambulatory Visit | Attending: Endocrinology | Admitting: Endocrinology

## 2015-04-13 DIAGNOSIS — C73 Malignant neoplasm of thyroid gland: Secondary | ICD-10-CM | POA: Diagnosis not present

## 2015-04-13 MED ORDER — THYROTROPIN ALFA 1.1 MG IM SOLR
0.9000 mg | INTRAMUSCULAR | Status: AC
  Administered 2015-04-13: 0.9 mg via INTRAMUSCULAR

## 2015-04-14 ENCOUNTER — Encounter (HOSPITAL_COMMUNITY)
Admission: RE | Admit: 2015-04-14 | Discharge: 2015-04-14 | Disposition: A | Discharge status: Home / Self Care | Payer: BLUE CROSS/BLUE SHIELD | Source: Ambulatory Visit | Attending: Endocrinology | Admitting: Endocrinology

## 2015-04-14 DIAGNOSIS — C73 Malignant neoplasm of thyroid gland: Secondary | ICD-10-CM | POA: Diagnosis not present

## 2015-04-14 LAB — HCG, SERUM, QUALITATIVE: Preg, Serum: NEGATIVE

## 2015-04-14 MED ORDER — SODIUM IODIDE I 131 CAPSULE
4.0000 | Freq: Once | INTRAVENOUS | Status: AC | PRN
Start: 2015-04-14 — End: 2015-04-14
  Administered 2015-04-14: 4 via ORAL

## 2015-04-16 ENCOUNTER — Encounter (HOSPITAL_COMMUNITY)
Admission: RE | Admit: 2015-04-16 | Discharge: 2015-04-16 | Disposition: A | Discharge status: Home / Self Care | Payer: BLUE CROSS/BLUE SHIELD | Source: Ambulatory Visit | Attending: Endocrinology | Admitting: Endocrinology

## 2015-04-16 DIAGNOSIS — C73 Malignant neoplasm of thyroid gland: Secondary | ICD-10-CM | POA: Diagnosis not present

## 2015-04-16 MED ORDER — SODIUM IODIDE I 131 CAPSULE
4.0000 | Freq: Once | INTRAVENOUS | Status: AC | PRN
  Administered 2015-04-16: 4 via ORAL

## 2015-05-13 ENCOUNTER — Ambulatory Visit
Admission: RE | Admit: 2015-05-13 | Discharge: 2015-05-13 | Disposition: A | Discharge status: Home / Self Care | Payer: BLUE CROSS/BLUE SHIELD | Source: Ambulatory Visit

## 2015-05-13 DIAGNOSIS — Z1231 Encounter for screening mammogram for malignant neoplasm of breast: Secondary | ICD-10-CM

## 2016-05-09 ENCOUNTER — Other Ambulatory Visit: Payer: Self-pay

## 2016-05-09 DIAGNOSIS — Z1231 Encounter for screening mammogram for malignant neoplasm of breast: Secondary | ICD-10-CM

## 2016-05-18 ENCOUNTER — Other Ambulatory Visit: Payer: Self-pay

## 2016-05-22 ENCOUNTER — Ambulatory Visit
Admission: RE | Admit: 2016-05-22 | Discharge: 2016-05-22 | Disposition: A | Discharge status: Home / Self Care | Payer: 59 | Source: Ambulatory Visit

## 2016-05-22 DIAGNOSIS — Z1231 Encounter for screening mammogram for malignant neoplasm of breast: Secondary | ICD-10-CM

## 2016-11-15 ENCOUNTER — Other Ambulatory Visit: Payer: Self-pay | Admitting: Obstetrics and Gynecology

## 2016-11-15 DIAGNOSIS — N63 Unspecified lump in unspecified breast: Secondary | ICD-10-CM

## 2016-11-23 ENCOUNTER — Ambulatory Visit
Admission: RE | Admit: 2016-11-23 | Discharge: 2016-11-23 | Disposition: A | Discharge status: Home / Self Care | Payer: 59 | Source: Ambulatory Visit | Attending: Obstetrics and Gynecology | Admitting: Obstetrics and Gynecology

## 2016-11-23 DIAGNOSIS — N63 Unspecified lump in unspecified breast: Secondary | ICD-10-CM

## 2017-07-03 ENCOUNTER — Other Ambulatory Visit: Payer: Self-pay | Admitting: Obstetrics and Gynecology

## 2017-07-03 DIAGNOSIS — Z1231 Encounter for screening mammogram for malignant neoplasm of breast: Secondary | ICD-10-CM

## 2017-07-19 ENCOUNTER — Ambulatory Visit
Admission: RE | Admit: 2017-07-19 | Discharge: 2017-07-19 | Disposition: A | Discharge status: Home / Self Care | Payer: 59 | Source: Ambulatory Visit | Attending: Obstetrics and Gynecology | Admitting: Obstetrics and Gynecology

## 2017-07-19 DIAGNOSIS — Z1231 Encounter for screening mammogram for malignant neoplasm of breast: Secondary | ICD-10-CM

## 2018-07-18 ENCOUNTER — Other Ambulatory Visit: Payer: Self-pay | Admitting: Obstetrics and Gynecology

## 2018-07-18 DIAGNOSIS — Z1231 Encounter for screening mammogram for malignant neoplasm of breast: Secondary | ICD-10-CM

## 2018-08-13 ENCOUNTER — Ambulatory Visit
Admission: RE | Admit: 2018-08-13 | Discharge: 2018-08-13 | Disposition: A | Discharge status: Home / Self Care | Payer: 59 | Source: Ambulatory Visit | Attending: Obstetrics and Gynecology | Admitting: Obstetrics and Gynecology

## 2018-08-13 ENCOUNTER — Ambulatory Visit: Payer: 59

## 2018-08-13 DIAGNOSIS — Z1231 Encounter for screening mammogram for malignant neoplasm of breast: Secondary | ICD-10-CM

## 2019-04-30 ENCOUNTER — Other Ambulatory Visit: Payer: Self-pay | Admitting: Obstetrics and Gynecology

## 2019-04-30 DIAGNOSIS — Z1231 Encounter for screening mammogram for malignant neoplasm of breast: Secondary | ICD-10-CM

## 2019-05-02 ENCOUNTER — Other Ambulatory Visit: Payer: Self-pay | Admitting: Family Medicine

## 2019-05-02 ENCOUNTER — Other Ambulatory Visit: Payer: Self-pay | Admitting: Obstetrics and Gynecology

## 2019-05-02 DIAGNOSIS — N6320 Unspecified lump in the left breast, unspecified quadrant: Secondary | ICD-10-CM

## 2019-05-05 ENCOUNTER — Ambulatory Visit
Admission: RE | Admit: 2019-05-05 | Discharge: 2019-05-05 | Disposition: A | Discharge status: Home / Self Care | Payer: 59 | Source: Ambulatory Visit | Attending: Family Medicine | Admitting: Family Medicine

## 2019-05-05 ENCOUNTER — Other Ambulatory Visit: Payer: Self-pay

## 2019-05-05 ENCOUNTER — Ambulatory Visit: Payer: 59

## 2019-05-05 ENCOUNTER — Other Ambulatory Visit: Payer: Self-pay | Admitting: Family Medicine

## 2019-05-05 DIAGNOSIS — N6320 Unspecified lump in the left breast, unspecified quadrant: Secondary | ICD-10-CM

## 2021-01-25 ENCOUNTER — Other Ambulatory Visit: Payer: Self-pay | Admitting: Family Medicine

## 2021-01-25 DIAGNOSIS — Z1231 Encounter for screening mammogram for malignant neoplasm of breast: Secondary | ICD-10-CM

## 2021-02-02 ENCOUNTER — Ambulatory Visit: Payer: 59

## 2021-02-03 ENCOUNTER — Other Ambulatory Visit: Payer: Self-pay

## 2021-02-03 ENCOUNTER — Ambulatory Visit
Admission: RE | Admit: 2021-02-03 | Discharge: 2021-02-03 | Disposition: A | Discharge status: Home / Self Care | Payer: 59 | Source: Ambulatory Visit | Attending: Family Medicine | Admitting: Family Medicine

## 2021-02-03 DIAGNOSIS — Z1231 Encounter for screening mammogram for malignant neoplasm of breast: Secondary | ICD-10-CM

## 2021-03-16 ENCOUNTER — Ambulatory Visit: Payer: 59

## 2021-09-25 ENCOUNTER — Encounter (HOSPITAL_BASED_OUTPATIENT_CLINIC_OR_DEPARTMENT_OTHER): Payer: Self-pay | Admitting: Emergency Medicine

## 2021-09-25 ENCOUNTER — Emergency Department (HOSPITAL_BASED_OUTPATIENT_CLINIC_OR_DEPARTMENT_OTHER)
Admission: EM | Admit: 2021-09-25 | Discharge: 2021-09-25 | Disposition: A | Discharge status: Home / Self Care | Payer: 59 | Attending: Emergency Medicine | Admitting: Emergency Medicine

## 2021-09-25 ENCOUNTER — Other Ambulatory Visit: Payer: Self-pay

## 2021-09-25 DIAGNOSIS — I10 Essential (primary) hypertension: Secondary | ICD-10-CM | POA: Insufficient documentation

## 2021-09-25 DIAGNOSIS — F419 Anxiety disorder, unspecified: Secondary | ICD-10-CM

## 2021-09-25 DIAGNOSIS — R519 Headache, unspecified: Secondary | ICD-10-CM

## 2021-09-25 DIAGNOSIS — Z85850 Personal history of malignant neoplasm of thyroid: Secondary | ICD-10-CM | POA: Diagnosis not present

## 2021-09-25 DIAGNOSIS — E039 Hypothyroidism, unspecified: Secondary | ICD-10-CM | POA: Diagnosis not present

## 2021-09-25 HISTORY — DX: Essential (primary) hypertension: I10

## 2021-09-25 MED ORDER — HYDROXYZINE HCL 25 MG PO TABS: 25.0000 mg | ORAL_TABLET | Freq: Four times a day (QID) | ORAL | 0 refills | Status: AC

## 2021-09-25 NOTE — ED Provider Notes (Signed)
[facility] EMERGENCY DEPARTMENT Provider Note   CSN: [id number] Arrival date & time: 09/25/21  2992     History Chief Complaint  Patient presents with   Facial Pain    Shonique Pelphrey Murphy-Torain is a 55 y.o. female.  Patient is a 55 year old female who presents with some tense feeling in the left side of her face and arm.  She has a history of hypertension, prior thyroid cancer.  She says over the last week she has had some intermittent feelings of tension in her face.  Some the left side of her face and around her left eye.  At times it goes down her neck and into her arm.  She does not really describe it as a pain although sometimes she says it feels like it is burning.  She has no numbness or weakness in her face or arm.  She has a little bit of pain in her neck on the left side.  She denies any vision changes.  No speech deficits.  No associated chest pain or shortness of breath.  No ongoing headaches.  She currently feels a little tenseness but denies any pain or other symptoms.  She says that she does some anxiety techniques to relax her muscles and that usually will ease it off.  Sometimes she will put a warm heating pad on her face or neck.  She has had similar symptoms about 2 years ago and it was felt to be anxiety at that time.  She did take some Atarax at times which seemed to help.        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/2008   Ovarian cyst, left    persistent    Seasonal allergies    Thyroid goiter     Patient Active Problem List   Diagnosis Date Noted   S/P myomectomy 03/17/2014   Other and unspecified ovarian cyst 10/19/2012   Fibroids 10/19/2012    Past Surgical History:  Procedure Laterality Date   BREAST EXCISIONAL BIOPSY Left 2010   fibroadenoma removed   BREAST SURGERY Left     left - cyst removed Patient does not have visible scar    CESAREAN SECTION  2005   x 1   CYSTOSCOPY N/A 03/17/2014   Procedure: CYSTOSCOPY;  Surgeon: Betsy Coder, MD;  Location: [facility];  Service: Gynecology;  Laterality: N/A;   EXPLORATORY LAPAROTOMY WITH ABDOMINAL MASS EXCISION  03/17/2014   Procedure: ABDOMINAL MYOMECTOMY WITH LEFT SALPINGO OOPHORECTOMY LYSIS OF ADHESIONS;  Surgeon: Betsy Coder, MD;  Location: [facility];  Service: Gynecology;;   HYSTEROSCOPY WITH D & C  02/07/02   LAPAROSCOPY     fibroid   MYOMECTOMY     TOTAL THYROIDECTOMY     WISDOM TOOTH EXTRACTION       OB History     Gravida  1   Para  1   Term      Preterm      AB      Living  1      SAB      IAB      Ectopic      Multiple      Live Births              Family  History  Problem Relation Age of Onset   Diabetes Paternal Aunt    Heart disease Maternal Grandfather        CHF    Diabetes Paternal Grandmother    Breast cancer Paternal Grandmother     Social History   Tobacco Use   Smoking status: Never   Smokeless tobacco: Never  Vaping Use   Vaping Use: Never used  Substance Use Topics   Alcohol use: No   Drug use: No    Home Medications Prior to Admission medications   Medication Sig Start Date End Date Taking? Authorizing Provider  hydrOXYzine (ATARAX/VISTARIL) 25 MG tablet Take 1 tablet (25 mg total) by mouth every 6 (six) hours. 09/25/21  Yes Malvin Johns, MD  Ascorbic Acid (VITAMIN C) 1000 MG tablet Take 2,000 mg by mouth daily.     [provider]  calcium-vitamin D (OSCAL) 250-125 MG-UNIT per tablet Take 1 tablet by mouth daily.    [provider]  Garlic 7425 MG CAPS Take 2,000 mg by mouth daily.    [provider]  HYDROcodone-acetaminophen (NORCO/VICODIN) 5-325 MG per tablet Take 1-2 tablets by mouth every 4 (four) hours as needed for moderate pain. 03/19/14   Crawford Givens, MD  ibuprofen (ADVIL,MOTRIN) 600 MG tablet Take 1 tablet  (600 mg total) by mouth every 6 (six) hours. 03/23/14   Crawford Givens, MD  levothyroxine (SYNTHROID, LEVOTHROID) 200 MCG tablet Take 88 mcg by mouth daily.    [provider]  metoCLOPramide (REGLAN) 10 MG tablet Take 1 tablet (10 mg total) by mouth 4 (four) times daily -  before meals and at bedtime. 03/19/14 03/24/14  Crawford Givens, MD  Multiple Minerals-Vitamins (CALCIUM-MAGNESIUM-ZINC-D3 PO) Take 30 mLs by mouth daily.    [provider]  ondansetron (ZOFRAN) 8 MG tablet Take 1 tablet (8 mg total) by mouth every 8 (eight) hours as needed for nausea or vomiting. 03/19/14   Crawford Givens, MD    Allergies    Betadine [povidone iodine]  Review of Systems   Review of Systems  Constitutional:  Negative for chills, diaphoresis, fatigue and fever.  HENT:  Negative for congestion, rhinorrhea and sneezing.   Eyes: Negative.   Respiratory:  Negative for cough, chest tightness and shortness of breath.   Cardiovascular:  Negative for chest pain and leg swelling.  Gastrointestinal:  Negative for abdominal pain, blood in stool, diarrhea, nausea and vomiting.  Genitourinary:  Negative for difficulty urinating, flank pain, frequency and hematuria.  Musculoskeletal:  Negative for arthralgias and back pain.  Skin:  Negative for rash.  Neurological:  Negative for dizziness, speech difficulty, weakness, numbness and headaches.       Tenseness to the left side of the face and left arm   Physical Exam Updated Vital Signs BP 140/87 (BP Location: Right Arm)   Pulse 80   Temp 98.2 F (36.8 C) (Oral)   Resp 19   Ht 5\' 2"  (1.575 m)   Wt 78 kg   SpO2 100%   BMI 31.46 kg/m   Physical Exam Constitutional:      Appearance: She is well-developed.  HENT:     Head: Normocephalic and atraumatic.  Eyes:     Pupils: Pupils are equal, round, and reactive to light.  Neck:     Comments: Mild tenderness over the left paracervical area.  There is no pain along the spine. Cardiovascular:      Rate and Rhythm: Normal rate and regular rhythm.     Heart  sounds: Normal heart sounds.  Pulmonary:     Effort: Pulmonary effort is normal. No respiratory distress.     Breath sounds: Normal breath sounds. No wheezing or rales.  Chest:     Chest wall: No tenderness.  Abdominal:     General: Bowel sounds are normal.     Palpations: Abdomen is soft.     Tenderness: There is no abdominal tenderness. There is no guarding or rebound.  Musculoskeletal:        General: Normal range of motion.     Cervical back: Normal range of motion and neck supple.  Lymphadenopathy:     Cervical: No cervical adenopathy.  Skin:    General: Skin is warm and dry.     Findings: No rash.  Neurological:     Mental Status: She is alert and oriented to person, place, and time.     Comments: Motor 5/5 all extremities Sensation grossly intact to LT all extremities Finger to Nose intact, no pronator drift CN II-XII grossly intact      ED Results / Procedures / Treatments   Labs (all labs ordered are listed, but only abnormal results are displayed) Labs Reviewed - No data to display  EKG EKG Interpretation  Date/Time:  Sunday September 25 2021 06:47:23 EDT Ventricular Rate:  70 PR Interval:  183 QRS Duration: 83 QT Interval:  378 QTC Calculation: 408 R Axis:   -2 Text Interpretation: Sinus rhythm Low voltage, precordial leads since last tracing no significant change Confirmed by Jakhi Dishman ([id number]) on 09/25/2021 7:10:11 AM  Radiology No results found.  Procedures Procedures   Medications Ordered in ED Medications - No data to display  ED Course  I have reviewed the triage vital signs and the nursing notes.  Pertinent labs & imaging results that were available during my care of the patient were reviewed by me and considered in my medical decision making (see chart for details).    MDM Rules/Calculators/A&P                           Patient presents with feeling of tenseness to the left  face and left arm and at times a burning pain.  There is no distinct headaches.  She has some mild tenderness in the left cervical area.  Her EKG does not show any ischemic changes.  She does not have any symptoms that sound more concerning for stroke.  No neurologic deficits.  No symptoms that sound more concerning for ACS.  This definitely could be stress/anxiety related.  She does report that she has worsening symptoms when she is stressed and has been under a lot of stress recently.  It could represent a radicular pain from her cervical area.  I advised her to try stress relieving techniques and we will prescribe her Atarax.  She will follow-up with her PCP this week for recheck.  If she has any ongoing or worsening symptoms, she may need an MRI of her cervical spine.  Return precautions were given. Final Clinical Impression(s) / ED Diagnoses Final diagnoses:  Anxiety  Facial pain    Rx / DC Orders ED Discharge Orders          Ordered    hydrOXYzine (ATARAX/VISTARIL) 25 MG tablet  Every 6 hours        10 /23/22 0735             Malvin Johns, MD 09/25/21 [phone]

## 2021-09-25 NOTE — Discharge Instructions (Signed)
Use the techniques that we talked about to help manage your stress.  You can use Atarax as needed for anxiety.  Follow-up with your primary care doctor for recheck.  Return here as needed if you have any worsening symptoms.

## 2021-09-25 NOTE — ED Triage Notes (Signed)
Pt reports mildly elevated BPs at home, and feeling tension in the L side of her face and L arm intermittently for about a week. She also reports some muscle tension in BIL trapezius muscles. Pt states she has tried stress relieving techniques at home with some relief. Denies numbness or weakness, dizziness, confusion or other sx. Pt concerned about intermittent sharp pains to L side of head that only last for a few seconds. Pt appears to be in NAD and is concerned for anxiety.

## 2022-01-06 ENCOUNTER — Other Ambulatory Visit: Payer: Self-pay | Admitting: Obstetrics and Gynecology

## 2022-01-06 DIAGNOSIS — Z1231 Encounter for screening mammogram for malignant neoplasm of breast: Secondary | ICD-10-CM

## 2022-02-06 ENCOUNTER — Ambulatory Visit
Admission: RE | Admit: 2022-02-06 | Discharge: 2022-02-06 | Disposition: A | Discharge status: Home / Self Care | Payer: 59 | Source: Ambulatory Visit | Attending: Obstetrics and Gynecology | Admitting: Obstetrics and Gynecology

## 2022-02-06 ENCOUNTER — Other Ambulatory Visit: Payer: Self-pay

## 2022-02-06 DIAGNOSIS — Z1231 Encounter for screening mammogram for malignant neoplasm of breast: Secondary | ICD-10-CM

## 2022-05-10 ENCOUNTER — Other Ambulatory Visit (HOSPITAL_COMMUNITY): Payer: Self-pay | Admitting: Family Medicine

## 2022-05-10 ENCOUNTER — Other Ambulatory Visit: Payer: Self-pay | Admitting: Family Medicine

## 2022-05-10 DIAGNOSIS — E7849 Other hyperlipidemia: Secondary | ICD-10-CM

## 2022-05-15 ENCOUNTER — Other Ambulatory Visit (HOSPITAL_COMMUNITY): Payer: 59

## 2022-05-16 ENCOUNTER — Ambulatory Visit (HOSPITAL_COMMUNITY)
Admission: RE | Admit: 2022-05-16 | Discharge: 2022-05-16 | Disposition: A | Discharge status: Home / Self Care | Payer: Self-pay | Source: Ambulatory Visit | Attending: Family Medicine | Admitting: Family Medicine

## 2022-05-16 DIAGNOSIS — E7849 Other hyperlipidemia: Secondary | ICD-10-CM | POA: Insufficient documentation

## 2022-05-29 ENCOUNTER — Other Ambulatory Visit: Payer: Self-pay | Admitting: Family Medicine

## 2022-05-29 DIAGNOSIS — D734 Cyst of spleen: Secondary | ICD-10-CM

## 2022-06-13 ENCOUNTER — Ambulatory Visit
Admission: RE | Admit: 2022-06-13 | Discharge: 2022-06-13 | Disposition: A | Discharge status: Home / Self Care | Payer: 59 | Source: Ambulatory Visit | Attending: Family Medicine | Admitting: Family Medicine

## 2022-06-13 DIAGNOSIS — D734 Cyst of spleen: Secondary | ICD-10-CM

## 2022-06-13 MED ORDER — GADOBENATE DIMEGLUMINE 529 MG/ML IV SOLN
15.0000 mL | Freq: Once | INTRAVENOUS | Status: AC | PRN
  Administered 2022-06-13: 15 mL via INTRAVENOUS

## 2022-06-23 ENCOUNTER — Other Ambulatory Visit: Payer: Self-pay | Admitting: Family Medicine

## 2022-06-23 DIAGNOSIS — R1903 Right lower quadrant abdominal swelling, mass and lump: Secondary | ICD-10-CM

## 2022-07-21 ENCOUNTER — Ambulatory Visit
Admission: RE | Admit: 2022-07-21 | Discharge: 2022-07-21 | Disposition: A | Discharge status: Home / Self Care | Payer: 59 | Source: Ambulatory Visit | Attending: Family Medicine | Admitting: Family Medicine

## 2022-07-21 DIAGNOSIS — R1903 Right lower quadrant abdominal swelling, mass and lump: Secondary | ICD-10-CM

## 2022-07-21 MED ORDER — IOPAMIDOL (ISOVUE-300) INJECTION 61%
100.0000 mL | Freq: Once | INTRAVENOUS | Status: AC | PRN
  Administered 2022-07-21: 100 mL via INTRAVENOUS

## 2023-01-09 ENCOUNTER — Other Ambulatory Visit: Payer: Self-pay | Admitting: Obstetrics and Gynecology

## 2023-01-09 DIAGNOSIS — Z1231 Encounter for screening mammogram for malignant neoplasm of breast: Secondary | ICD-10-CM

## 2023-02-27 ENCOUNTER — Ambulatory Visit
Admission: RE | Admit: 2023-02-27 | Discharge: 2023-02-27 | Disposition: A | Discharge status: Home / Self Care | Payer: 59 | Source: Ambulatory Visit | Attending: Obstetrics and Gynecology | Admitting: Obstetrics and Gynecology

## 2023-02-27 DIAGNOSIS — Z1231 Encounter for screening mammogram for malignant neoplasm of breast: Secondary | ICD-10-CM

## 2024-01-29 ENCOUNTER — Other Ambulatory Visit: Payer: Self-pay | Admitting: Obstetrics and Gynecology

## 2024-01-29 DIAGNOSIS — Z1231 Encounter for screening mammogram for malignant neoplasm of breast: Secondary | ICD-10-CM

## 2024-02-28 ENCOUNTER — Ambulatory Visit: Payer: 59

## 2024-03-04 ENCOUNTER — Ambulatory Visit

## 2024-03-07 ENCOUNTER — Ambulatory Visit
Admission: RE | Admit: 2024-03-07 | Discharge: 2024-03-07 | Disposition: A | Discharge status: Home / Self Care | Source: Ambulatory Visit | Attending: Obstetrics and Gynecology | Admitting: Obstetrics and Gynecology

## 2024-03-07 DIAGNOSIS — Z1231 Encounter for screening mammogram for malignant neoplasm of breast: Secondary | ICD-10-CM
